# Patient Record
Sex: Female | Born: 2000 | Race: Black or African American | Hispanic: No | Marital: Single | Smoking: Never smoker
Health system: Southern US, Community
[De-identification: ages and names within clinical notes are randomized; demographics above are authoritative.]

## PROBLEM LIST (undated history)

## (undated) ENCOUNTER — Ambulatory Visit (HOSPITAL_COMMUNITY): Admission: EM | Source: Home / Self Care

## (undated) ENCOUNTER — Inpatient Hospital Stay (HOSPITAL_COMMUNITY): Payer: Self-pay

## (undated) DIAGNOSIS — Z789 Other specified health status: Secondary | ICD-10-CM

## (undated) DIAGNOSIS — O98319 Other infections with a predominantly sexual mode of transmission complicating pregnancy, unspecified trimester: Secondary | ICD-10-CM

## (undated) DIAGNOSIS — O99019 Anemia complicating pregnancy, unspecified trimester: Secondary | ICD-10-CM

## (undated) DIAGNOSIS — A568 Sexually transmitted chlamydial infection of other sites: Secondary | ICD-10-CM

## (undated) DIAGNOSIS — D509 Iron deficiency anemia, unspecified: Secondary | ICD-10-CM

## (undated) HISTORY — PX: NO PAST SURGERIES: SHX2092

---
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
## 2021-01-25 ENCOUNTER — Ambulatory Visit (HOSPITAL_COMMUNITY)
Admission: EM | Admit: 2021-01-25 | Discharge: 2021-01-25 | Disposition: A | Discharge status: Home / Self Care | Payer: Medicaid Other | Attending: Emergency Medicine | Admitting: Emergency Medicine

## 2021-01-25 ENCOUNTER — Encounter (HOSPITAL_COMMUNITY): Payer: Self-pay

## 2021-01-25 DIAGNOSIS — N898 Other specified noninflammatory disorders of vagina: Secondary | ICD-10-CM | POA: Diagnosis present

## 2021-01-25 NOTE — ED Triage Notes (Signed)
Pt c/o clear vaginal discharge with an odor x1wk. Pt c/o 2 bumps to vaginal area x3 days, denies drainage.

## 2021-01-25 NOTE — ED Provider Notes (Signed)
[facility]    CSN: [id number] Arrival date & time: 01/25/21  1413      History   Chief Complaint Chief Complaint  Patient presents with   Vaginal Discharge   Abscess    HPI Angela Hayden is a 20 y.o. female.   Patient presents with thick Angela Hayden discharge with odor for 7 days. Endorses 2 bumps to vaginal area for 3 days. Denies itching, irritation, urinary urgency, frequency, abdominal pain, flank pain, dysuria, hematuria, nausea. Has not attempted treatment. Sexually active, one partner, no condom use.   Past Medical History:  Diagnosis Date   Chlamydia trachomatis infection in pregnancy 07/20/2018   Iron deficiency anemia during pregnancy 07/20/2018   Medical history non-contributory     Patient Active Problem List   Diagnosis Date Noted   Postpartum anemia 08/09/2018   Postoperative fever 08/08/2018   Postpartum infection 08/08/2018   Nexplanon in place 07/23/2018    Past Surgical History:  Procedure Laterality Date   CESAREAN SECTION N/A 07/21/2018   Procedure: CESAREAN SECTION;  Surgeon: Adam Phenix, MD;  Location: [facility] BIRTHING SUIT[facility];  Service: Obstetrics;  Laterality: N/A;   NO PAST SURGERIES      OB History     Gravida  1   Para  1   Term  1   Preterm  0   AB  0   Living  1      SAB  0   IAB  0   Ectopic  0   Multiple  0   Live Births  1            Home Medications    Prior to Admission medications   Medication Sig Start Date End Date Taking? Authorizing Provider  amLODipine (NORVASC) 2.5 MG tablet Take 1 tablet (2.5 mg total) by mouth daily. Patient not taking: Reported on 10/04/2018 08/15/18 09/28/20  Adam Phenix, MD  ferrous sulfate 325 (65 FE) MG tablet Take 1 tablet (325 mg total) by mouth daily with breakfast. 09/25/18 09/28/20  Adam Phenix, MD    Family History Family History  Problem Relation Age of Onset   Healthy Mother    Healthy Father     Social History Social History   Tobacco Use    Smoking status: Never   Smokeless tobacco: Never  Vaping Use   Vaping Use: Never used  Substance Use Topics   Alcohol use: Never   Drug use: Never     Allergies   Patient has no known allergies.   Review of Systems Review of Systems  Constitutional: Negative.   Respiratory: Negative.    Cardiovascular: Negative.   Genitourinary:  Positive for vaginal discharge. Negative for decreased urine volume, difficulty urinating, dyspareunia, dysuria, enuresis, flank pain, frequency, genital sores, hematuria, menstrual problem, pelvic pain, urgency, vaginal bleeding and vaginal pain.  Skin: Negative.   Neurological: Negative.     Physical Exam Triage Vital Signs ED Triage Vitals  Enc Vitals Group     BP 01/25/21 1511 104/71     Pulse Rate 01/25/21 1511 88     Resp 01/25/21 1511 18     Temp 01/25/21 1511 98.3 F (36.8 C)     Temp Source 01/25/21 1511 Oral     SpO2 01/25/21 1511 98 %     Weight --      Height --      Head Circumference --      Peak Flow --      Pain  Score 01/25/21 1512 0     Pain Loc --      Pain Edu? --      Excl. in GC? --    No data found.  Updated Vital Signs BP 104/71 (BP Location: Left Arm)   Pulse 88   Temp 98.3 F (36.8 C) (Oral)   Resp 18   LMP 01/25/2021   SpO2 98%   Visual Acuity Right Eye Distance:   Left Eye Distance:   Bilateral Distance:    Right Eye Near:   Left Eye Near:    Bilateral Near:     Physical Exam Constitutional:      Appearance: Normal appearance. She is normal weight.  HENT:     Head: Normocephalic.  Pulmonary:     Effort: Pulmonary effort is normal.  Genitourinary:    Comments: Deferred,self collect vaginal swab Skin:    General: Skin is warm and dry.  Neurological:     Mental Status: She is alert and oriented to person, place, and time. Mental status is at baseline.     UC Treatments / Results  Labs (all labs ordered are listed, but only abnormal results are displayed) Labs Reviewed  CERVICOVAGINAL  ANCILLARY ONLY    EKG   Radiology No results found.  Procedures Procedures (including critical care time)  Medications Ordered in UC Medications - No data to display  Initial Impression / Assessment and Plan / UC Course  I have reviewed the triage vital signs and the nursing notes.  Pertinent labs & imaging results that were available during my care of the patient were reviewed by me and considered in my medical decision making (see chart for details).  Vaginal discharge  Unable to complete pelvic exam to assess lesions due to time constraint of patient  Sti screening pending, will treat per protocol, advised abstinence until labs results and/or treatment complete  Final Clinical Impressions(s) / UC Diagnoses   Final diagnoses:  Vaginal discharge     Discharge Instructions      Labs pending 2-3 days, you will be contacted if positive for any sti and treatment will be sent to the pharmacy, you will have to return to the clinic if positive for gonorrhea to receive treatment   Please refrain from having sex until labs results, if positive please refrain from having sex until treatment complete and symptoms resolve   If positive for Chlamydia  gonorrhea or trichomoniasis please notify partner or partners so they may tested as well  Moving forward, it is recommended you use some form of protection against the transmission of sti infections  such as condoms or dental dams with each sexual encounter     ED Prescriptions   None    PDMP not reviewed this encounter.   Valinda Hoar, [place] 01/25/21 [phone]

## 2021-01-25 NOTE — Discharge Instructions (Signed)
Labs pending 2-3 days, you will be contacted if positive for any sti and treatment will be sent to the pharmacy, you will have to return to the clinic if positive for gonorrhea to receive treatment   Please refrain from having sex until labs results, if positive please refrain from having sex until treatment complete and symptoms resolve   If positive for  Chlamydia  gonorrhea or trichomoniasis please notify partner or partners so they may tested as well  Moving forward, it is recommended you use some form of protection against the transmission of sti infections  such as condoms or dental dams with each sexual encounter    

## 2021-01-26 LAB — CERVICOVAGINAL ANCILLARY ONLY
Bacterial Vaginitis (gardnerella): POSITIVE — AB
Candida Glabrata: NEGATIVE
Candida Vaginitis: NEGATIVE
Chlamydia: NEGATIVE
Comment: NEGATIVE
Comment: NEGATIVE
Comment: NEGATIVE
Comment: NEGATIVE
Comment: NEGATIVE
Comment: NORMAL
Neisseria Gonorrhea: NEGATIVE
Trichomonas: NEGATIVE

## 2021-01-27 ENCOUNTER — Telehealth (HOSPITAL_COMMUNITY): Payer: Self-pay | Admitting: Emergency Medicine

## 2021-01-27 MED ORDER — METRONIDAZOLE 500 MG PO TABS: 500.0000 mg | ORAL_TABLET | Freq: Two times a day (BID) | ORAL | 0 refills | Status: DC

## 2021-01-27 MED ORDER — METRONIDAZOLE 0.75 % VA GEL: 1.0000 | Freq: Every day | VAGINAL | 0 refills | Status: DC

## 2021-03-10 ENCOUNTER — Other Ambulatory Visit: Payer: Self-pay

## 2021-03-10 ENCOUNTER — Ambulatory Visit (HOSPITAL_COMMUNITY)
Admission: EM | Admit: 2021-03-10 | Discharge: 2021-03-10 | Disposition: A | Discharge status: Home / Self Care | Payer: Medicaid Other | Attending: Physician Assistant | Admitting: Physician Assistant

## 2021-03-10 ENCOUNTER — Encounter (HOSPITAL_COMMUNITY): Payer: Self-pay

## 2021-03-10 DIAGNOSIS — N760 Acute vaginitis: Secondary | ICD-10-CM

## 2021-03-10 DIAGNOSIS — N898 Other specified noninflammatory disorders of vagina: Secondary | ICD-10-CM

## 2021-03-10 DIAGNOSIS — B9689 Other specified bacterial agents as the cause of diseases classified elsewhere: Secondary | ICD-10-CM | POA: Diagnosis not present

## 2021-03-10 DIAGNOSIS — Z113 Encounter for screening for infections with a predominantly sexual mode of transmission: Secondary | ICD-10-CM

## 2021-03-10 LAB — HIV ANTIBODY (ROUTINE TESTING W REFLEX): HIV Screen 4th Generation wRfx: NONREACTIVE

## 2021-03-10 LAB — POC URINE PREG, ED: Preg Test, Ur: NEGATIVE

## 2021-03-10 MED ORDER — METRONIDAZOLE 500 MG PO TABS: 500.0000 mg | ORAL_TABLET | Freq: Two times a day (BID) | ORAL | 0 refills | Status: DC

## 2021-03-10 NOTE — Discharge Instructions (Addendum)
We are treating you for bacterial vaginosis.  Please take metronidazole 500 mg twice daily for 7 days.  You cannot drink any alcohol while taking this medication and for 72 hours after completing course this will cause you to vomit.  I recommend you wear loosefitting cotton underwear and use hypoallergenic soaps and detergents.  We will contact you if we need to arrange any additional treatment.  If your symptoms persist please return for reevaluation.Angela Hayden

## 2021-03-10 NOTE — ED Provider Notes (Signed)
[facility]    CSN: [id number] Arrival date & time: 03/10/21  1702      History   Chief Complaint Chief Complaint  Patient presents with   Vaginal Discharge    HPI Angela Hayden is a 20 y.o. female.   Patient presents today with a 2-day history of vaginal discharge.  She describes this as copious, clear, malodorous.  She does have a history of bacterial vaginosis and was last treated August 2022 but reports not completing the complete course of medication as she kept forgetting.  She does report a new sexual partner and is interested in complete STI testing.  She denies any additional antibiotic use.  Denies any changes to personal hygiene products including soaps or detergents.  She denies any pelvic pain, abdominal pain, fever, nausea, vomiting, chest pain.  She does not believe she is pregnant but is interested in testing.   Past Medical History:  Diagnosis Date   Chlamydia trachomatis infection in pregnancy 07/20/2018   Iron deficiency anemia during pregnancy 07/20/2018   Medical history non-contributory     Patient Active Problem List   Diagnosis Date Noted   Postpartum anemia 08/09/2018   Postoperative fever 08/08/2018   Postpartum infection 08/08/2018   Nexplanon in place 07/23/2018    Past Surgical History:  Procedure Laterality Date   CESAREAN SECTION N/A 07/21/2018   Procedure: CESAREAN SECTION;  Surgeon: Adam Phenix, MD;  Location: [facility] BIRTHING SU[facility];  Service: Obstetrics;  Laterality: N/A;   NO PAST SURGERIES      OB History     Gravida  1   Para  1   Term  1   Preterm  0   AB  0   Living  1      SAB  0   IAB  0   Ectopic  0   Multiple  0   Live Births  1            Home Medications    Prior to Admission medications   Medication Sig Start Date End Date Taking? Authorizing Provider  metroNIDAZOLE (FLAGYL) 500 MG tablet Take 1 tablet (500 mg total) by mouth 2 (two) times daily. 03/10/21   Gavino Fouch, Noberto Retort, PA-C   amLODipine (NORVASC) 2.5 MG tablet Take 1 tablet (2.5 mg total) by mouth daily. Patient not taking: Reported on 10/04/2018 08/15/18 09/28/20  Adam Phenix, MD  ferrous sulfate 325 (65 FE) MG tablet Take 1 tablet (325 mg total) by mouth daily with breakfast. 09/25/18 09/28/20  Adam Phenix, MD    Family History Family History  Problem Relation Age of Onset   Healthy Mother    Healthy Father     Social History Social History   Tobacco Use   Smoking status: Never   Smokeless tobacco: Never  Vaping Use   Vaping Use: Never used  Substance Use Topics   Alcohol use: Never   Drug use: Never     Allergies   Patient has no known allergies.   Review of Systems Review of Systems  Constitutional:  Negative for activity change, appetite change, fatigue and fever.  Respiratory:  Negative for cough and shortness of breath.   Cardiovascular:  Negative for chest pain.  Gastrointestinal:  Negative for abdominal pain, diarrhea, nausea and vomiting.  Genitourinary:  Positive for vaginal discharge. Negative for flank pain, frequency, pelvic pain, urgency, vaginal bleeding and vaginal pain.  Neurological:  Negative for dizziness, light-headedness and headaches.    Physical  Exam Triage Vital Signs ED Triage Vitals  Enc Vitals Group     BP 03/10/21 1814 117/74     Pulse Rate 03/10/21 1814 77     Resp 03/10/21 1814 16     Temp 03/10/21 1814 99.2 F (37.3 C)     Temp Source 03/10/21 1814 Oral     SpO2 03/10/21 1814 98 %     Weight --      Height --      Head Circumference --      Peak Flow --      Pain Score 03/10/21 1813 0     Pain Loc --      Pain Edu? --      Excl. in GC? --    No data found.  Updated Vital Signs BP 117/74 (BP Location: Right Arm)   Pulse 77   Temp 99.2 F (37.3 C) (Oral)   Resp 16   SpO2 98%   Visual Acuity Right Eye Distance:   Left Eye Distance:   Bilateral Distance:    Right Eye Near:   Left Eye Near:    Bilateral Near:     Physical  Exam Vitals reviewed.  Constitutional:      General: She is awake. She is not in acute distress.    Appearance: Normal appearance. She is well-developed. She is not ill-appearing.     Comments: Very pleasant female present age no acute distress sitting comfortably in exam room  HENT:     Head: Normocephalic and atraumatic.  Cardiovascular:     Rate and Rhythm: Normal rate and regular rhythm.     Heart sounds: Normal heart sounds, S1 normal and S2 normal. No murmur heard. Pulmonary:     Effort: Pulmonary effort is normal.     Breath sounds: Normal breath sounds. No wheezing, rhonchi or rales.     Comments: Clear auscultation bilaterally Abdominal:     General: Bowel sounds are normal.     Palpations: Abdomen is soft.     Tenderness: There is no abdominal tenderness. There is no right CVA tenderness, left CVA tenderness, guarding or rebound.     Comments: Benign abdominal exam  Genitourinary:    Comments: Exam deferred Psychiatric:        Behavior: Behavior is cooperative.     UC Treatments / Results  Labs (all labs ordered are listed, but only abnormal results are displayed) Labs Reviewed  HIV ANTIBODY (ROUTINE TESTING W REFLEX)  RPR  HEPATITIS C ANTIBODY  POC URINE PREG, ED  CERVICOVAGINAL ANCILLARY ONLY    EKG   Radiology No results found.  Procedures Procedures (including critical care time)  Medications Ordered in UC Medications - No data to display  Initial Impression / Assessment and Plan / UC Course  I have reviewed the triage vital signs and the nursing notes.  Pertinent labs & imaging results that were available during my care of the patient were reviewed by me and considered in my medical decision making (see chart for details).      Urine pregnancy was negative in clinic today.  STI swab collected results pending.  Will empirically treat for bacterial vaginosis.  Patient prescribed metronidazole 500 mg twice daily for 7 days.  Discussed that she  should not drink any alcohol while taking this medication and for 72 hours after completing course due to Antabuse side effects.  Recommended she use loosefitting cotton underwear and hypoallergenic soaps and detergents.  She was to abstain from sexual  practices until STI results are obtained.  Discussed that if she is positive all partners will need to be tested and treated.  Discussed the importance of safe sex practices.  Strict return precautions given to which she expressed understanding.  Final Clinical Impressions(s) / UC Diagnoses   Final diagnoses:  Vaginal discharge  BV (bacterial vaginosis)  Routine screening for STI (sexually transmitted infection)     Discharge Instructions      We are treating you for bacterial vaginosis.  Please take metronidazole 500 mg twice daily for 7 days.  You cannot drink any alcohol while taking this medication and for 72 hours after completing course this will cause you to vomit.  I recommend you wear loosefitting cotton underwear and use hypoallergenic soaps and detergents.  We will contact you if we need to arrange any additional treatment.  If your symptoms persist please return for reevaluation..     ED Prescriptions     Medication Sig Dispense Auth. Provider   metroNIDAZOLE (FLAGYL) 500 MG tablet Take 1 tablet (500 mg total) by mouth 2 (two) times daily. 14 tablet Travaughn Vue, Noberto Retort, PA-C      PDMP not reviewed this encounter.   Jeani Hawking, PA-C 03/10/21 1851

## 2021-03-10 NOTE — ED Triage Notes (Signed)
Pt reports vaginal discharge with strong odor x 2 days. Pt reports she did not finished the bacterial vaginosis the last time.

## 2021-03-11 ENCOUNTER — Telehealth (HOSPITAL_COMMUNITY): Payer: Self-pay | Admitting: Emergency Medicine

## 2021-03-11 LAB — CERVICOVAGINAL ANCILLARY ONLY
Bacterial Vaginitis (gardnerella): POSITIVE — AB
Candida Glabrata: NEGATIVE
Candida Vaginitis: POSITIVE — AB
Chlamydia: NEGATIVE
Comment: NEGATIVE
Comment: NEGATIVE
Comment: NEGATIVE
Comment: NEGATIVE
Comment: NEGATIVE
Comment: NORMAL
Neisseria Gonorrhea: NEGATIVE
Trichomonas: NEGATIVE

## 2021-03-11 LAB — RPR: RPR Ser Ql: NONREACTIVE

## 2021-03-11 MED ORDER — FLUCONAZOLE 150 MG PO TABS: 150.0000 mg | ORAL_TABLET | Freq: Once | ORAL | 0 refills | Status: AC

## 2021-03-12 LAB — HEPATITIS C ANTIBODY: HCV Ab: <0.1 s/co ratio — AB (ref 0.0–0.9)

## 2021-03-29 ENCOUNTER — Encounter (HOSPITAL_COMMUNITY): Payer: Self-pay

## 2021-03-29 ENCOUNTER — Other Ambulatory Visit: Payer: Self-pay

## 2021-03-29 ENCOUNTER — Ambulatory Visit (HOSPITAL_COMMUNITY)
Admission: EM | Admit: 2021-03-29 | Discharge: 2021-03-29 | Disposition: A | Discharge status: Home / Self Care | Payer: Medicaid Other | Attending: Emergency Medicine | Admitting: Emergency Medicine

## 2021-03-29 DIAGNOSIS — B9689 Other specified bacterial agents as the cause of diseases classified elsewhere: Secondary | ICD-10-CM | POA: Diagnosis not present

## 2021-03-29 DIAGNOSIS — N760 Acute vaginitis: Secondary | ICD-10-CM

## 2021-03-29 MED ORDER — CLINDAMYCIN HCL 300 MG PO CAPS: 300.0000 mg | ORAL_CAPSULE | Freq: Two times a day (BID) | ORAL | 0 refills | Status: AC

## 2021-03-29 NOTE — Discharge Instructions (Addendum)
Take clindamycin twice a day for 7 days   Use boric acid suppository as preventative method, may use weekly and after sexual encounters to attempt to prevent symptoms  Do not have sex until symptoms resolve  May follow up with gynecology for reoccurring symptoms

## 2021-03-29 NOTE — ED Triage Notes (Signed)
Pt reports malodorous vaginal discharge x 1 month. States she was treated for bacterial vaginosis 3 weeks ago without relief.

## 2021-03-29 NOTE — ED Provider Notes (Signed)
[facility]    CSN: [id number] Arrival date & time: 03/29/21  2353      History   Chief Complaint Chief Complaint  Patient presents with   Vaginal Discharge    HPI Angela Hayden is a 20 y.o. female.   Patient presents with Angela Hayden thin vaginal discharge with odor occurring intermittently for 1 month.  Denies vaginal itching or irritation, urinary frequency or urgency, abdominal pain, flank pain, pelvic pain, hematuria, new rash or lesions.  Endorses she was seen on October 12 in urgent care, positive for bacterial vaginosis and yeast, completed treatment but symptoms did not resolve.  Endorses frequent BV infections.  Denies sexual symptoms since last testing.   Past Medical History:  Diagnosis Date   Chlamydia trachomatis infection in pregnancy 07/20/2018   Iron deficiency anemia during pregnancy 07/20/2018   Medical history non-contributory     Patient Active Problem List   Diagnosis Date Noted   Postpartum anemia 08/09/2018   Postoperative fever 08/08/2018   Postpartum infection 08/08/2018   Nexplanon in place 07/23/2018    Past Surgical History:  Procedure Laterality Date   CESAREAN SECTION N/A 07/21/2018   Procedure: CESAREAN SECTION;  Surgeon: Adam Phenix, MD;  Location: [facility]TH[facility] SU[facility];  Service: Obstetrics;  Laterality: N/A;   NO PAST SURGERIES      OB History     Gravida  1   Para  1   Term  1   Preterm  0   AB  0   Living  1      SAB  0   IAB  0   Ectopic  0   Multiple  0   Live Births  1            Home Medications    Prior to Admission medications   Medication Sig Start Date End Date Taking? Authorizing Provider  clindamycin (CLEOCIN) 300 MG capsule Take 1 capsule (300 mg total) by mouth in the morning and at bedtime for 7 days. 03/29/21 04/05/21 Yes Murline Weigel R, NP  metroNIDAZOLE (FLAGYL) 500 MG tablet Take 1 tablet (500 mg total) by mouth 2 (two) times daily. 03/10/21   Raspet, Noberto Retort, PA-C  amLODipine  (NORVASC) 2.5 MG tablet Take 1 tablet (2.5 mg total) by mouth daily. Patient not taking: Reported on 10/04/2018 08/15/18 09/28/20  Adam Phenix, MD  ferrous sulfate 325 (65 FE) MG tablet Take 1 tablet (325 mg total) by mouth daily with breakfast. 09/25/18 09/28/20  Adam Phenix, MD    Family History Family History  Problem Relation Age of Onset   Healthy Mother    Healthy Father     Social History Social History   Tobacco Use   Smoking status: Never   Smokeless tobacco: Never  Vaping Use   Vaping Use: Never used  Substance Use Topics   Alcohol use: Never   Drug use: Never     Allergies   Patient has no known allergies.   Review of Systems Review of Systems  Respiratory: Negative.    Cardiovascular: Negative.   Gastrointestinal: Negative.   Genitourinary:  Positive for vaginal discharge. Negative for decreased urine volume, difficulty urinating, dyspareunia, dysuria, enuresis, flank pain, frequency, genital sores, hematuria, menstrual problem, pelvic pain, urgency, vaginal bleeding and vaginal pain.  Musculoskeletal: Negative.   Skin: Negative.   Neurological: Negative.     Physical Exam Triage Vital Signs ED Triage Vitals  Enc Vitals Group     BP 03/29/21  0931 104/70     Pulse Rate 03/29/21 0931 70     Resp 03/29/21 0931 16     Temp 03/29/21 0931 98.1 F (36.7 C)     Temp Source 03/29/21 0931 Oral     SpO2 03/29/21 0931 98 %     Weight --      Height --      Head Circumference --      Peak Flow --      Pain Score 03/29/21 0930 0     Pain Loc --      Pain Edu? --      Excl. in [place]? --    No data found.  Updated Vital Signs BP 104/70 (BP Location: Left Arm)   Pulse 70   Temp 98.1 F (36.7 C) (Oral)   Resp 16   LMP  (Within Days)   SpO2 98%   Visual Acuity Right Eye Distance:   Left Eye Distance:   Bilateral Distance:    Right Eye Near:   Left Eye Near:    Bilateral Near:     Physical Exam Constitutional:      Appearance: Normal appearance.  She is normal weight.  HENT:     Head: Normocephalic.  Eyes:     Extraocular Movements: Extraocular movements intact.  Pulmonary:     Effort: Pulmonary effort is normal.  Genitourinary:    Comments: deferred Skin:    General: Skin is warm and dry.  Neurological:     Mental Status: She is alert and oriented to person, place, and time. Mental status is at baseline.  Psychiatric:        Mood and Affect: Mood normal.        Behavior: Behavior normal.     UC Treatments / Results  Labs (all labs ordered are listed, but only abnormal results are displayed) Labs Reviewed - No data to display  EKG   Radiology No results found.  Procedures Procedures (including critical care time)  Medications Ordered in UC Medications - No data to display  Initial Impression / Assessment and Plan / UC Course  I have reviewed the triage vital signs and the nursing notes.  Pertinent labs & imaging results that were available during my care of the patient were reviewed by me and considered in my medical decision making (see chart for details).  Bacterial vaginosis  Due to history and symptomology will prophylactically treat for BV, will defer testing at this time due to lack of sexual activity  1.  Clindamycin 300 mg twice daily for 7 days 2.  Recommended prophylactic weekly use of boric acid suppository  3.  Recommended follow-up with gynecology, resources given Final Clinical Impressions(s) / UC Diagnoses   Final diagnoses:  Bacterial vaginosis     Discharge Instructions      Take clindamycin twice a day for 7 days   Use boric acid suppository as preventative method, may use weekly and after sexual encounters to attempt to prevent symptoms  Do not have sex until symptoms resolve  May follow up with gynecology for reoccurring symptoms     ED Prescriptions     Medication Sig Dispense Auth. Provider   clindamycin (CLEOCIN) 300 MG capsule Take 1 capsule (300 mg total) by  mouth in the morning and at bedtime for 7 days. 14 capsule Carreen Milius, Leitha Schuller, NP      PDMP not reviewed this encounter.   Hans Eden, [place] 03/29/21 [phone]

## 2021-05-26 ENCOUNTER — Encounter (HOSPITAL_COMMUNITY): Payer: Self-pay

## 2021-05-26 ENCOUNTER — Other Ambulatory Visit: Payer: Self-pay

## 2021-05-26 ENCOUNTER — Ambulatory Visit (HOSPITAL_COMMUNITY)
Admission: RE | Admit: 2021-05-26 | Discharge: 2021-05-26 | Disposition: A | Discharge status: Home / Self Care | Payer: Medicaid Other | Source: Ambulatory Visit | Attending: Urgent Care | Admitting: Urgent Care

## 2021-05-26 VITALS — BP 104/54 | HR 82 | Temp 98.3°F | Resp 18

## 2021-05-26 DIAGNOSIS — N760 Acute vaginitis: Secondary | ICD-10-CM | POA: Diagnosis present

## 2021-05-26 LAB — POCT URINALYSIS DIPSTICK, ED / UC
Bilirubin Urine: NEGATIVE
Bilirubin Urine: NEGATIVE
Glucose, UA: NEGATIVE mg/dL
Glucose, UA: NEGATIVE mg/dL
Hgb urine dipstick: NEGATIVE
Hgb urine dipstick: NEGATIVE
Ketones, ur: NEGATIVE mg/dL
Leukocytes,Ua: NEGATIVE
Leukocytes,Ua: NEGATIVE
Nitrite: NEGATIVE
Nitrite: NEGATIVE
Protein, ur: NEGATIVE mg/dL
Protein, ur: NEGATIVE mg/dL
Specific Gravity, Urine: 1.025 (ref 1.005–1.030)
Specific Gravity, Urine: 1.025 (ref 1.005–1.030)
Urobilinogen, UA: 0.2 mg/dL (ref 0.0–1.0)
Urobilinogen, UA: 0.2 mg/dL (ref 0.0–1.0)
pH: 7 (ref 5.0–8.0)
pH: 7.5 (ref 5.0–8.0)

## 2021-05-26 LAB — POC URINE PREG, ED: Preg Test, Ur: NEGATIVE

## 2021-05-26 MED ORDER — TINIDAZOLE 500 MG PO TABS: 2.0000 g | ORAL_TABLET | Freq: Once | ORAL | 0 refills | Status: AC

## 2021-05-26 NOTE — ED Provider Notes (Signed)
[facility]    CSN: [id number] Arrival date & time: 05/26/21  1529      History   Chief Complaint Chief Complaint  Patient presents with   APPOINTMENT: Abnormal Vaginal Discharge    HPI Angela Hayden is a 20 y.o. female.   20 year old female presents today with concerns of 2-day history of vaginal discharge.  Does have a strong history of BV and states her symptoms feel similar.  She reports being monogamous with 1 partner.  She does not use protection.  She denies pelvic pain, fever, nausea, vomiting.  She denies any rash or swelling.      Past Medical History:  Diagnosis Date   Chlamydia trachomatis infection in pregnancy 07/20/2018   Iron deficiency anemia during pregnancy 07/20/2018   Medical history non-contributory     Patient Active Problem List   Diagnosis Date Noted   Postpartum anemia 08/09/2018   Postoperative fever 08/08/2018   Postpartum infection 08/08/2018   Nexplanon in place 07/23/2018    Past Surgical History:  Procedure Laterality Date   CESAREAN SECTION N/A 07/21/2018   Procedure: CESAREAN SECTION;  Surgeon: Woodroe Mode, MD;  Location: [facility];  Service: Obstetrics;  Laterality: N/A;   NO PAST SURGERIES      OB History     Gravida  1   Para  1   Term  1   Preterm  0   AB  0   Living  1      SAB  0   IAB  0   Ectopic  0   Multiple  0   Live Births  1            Home Medications    Prior to Admission medications   Medication Sig Start Date End Date Taking? Authorizing Provider  tinidazole (TINDAMAX) 500 MG tablet Take 4 tablets (2,000 mg total) by mouth once for 1 dose. 05/26/21 05/26/21 Yes Joannie Medine L, PA  amLODipine (NORVASC) 2.5 MG tablet Take 1 tablet (2.5 mg total) by mouth daily. Patient not taking: Reported on 10/04/2018 08/15/18 09/28/20  Woodroe Mode, MD  ferrous sulfate 325 (65 FE) MG tablet Take 1 tablet (325 mg total) by mouth daily with breakfast. 09/25/18 09/28/20  Woodroe Mode, MD    Family History Family History  Problem Relation Age of Onset   Healthy Mother    Healthy Father     Social History Social History   Tobacco Use   Smoking status: Never   Smokeless tobacco: Never  Vaping Use   Vaping Use: Never used  Substance Use Topics   Alcohol use: Never   Drug use: Never     Allergies   Patient has no known allergies.   Review of Systems Review of Systems  Constitutional: Negative.   HENT: Negative.    Eyes: Negative.   Respiratory: Negative.    Cardiovascular: Negative.   Gastrointestinal: Negative.   Genitourinary:  Positive for vaginal discharge. Negative for decreased urine volume, difficulty urinating, dyspareunia, dysuria, enuresis, flank pain, frequency, genital sores, hematuria, menstrual problem, pelvic pain, urgency, vaginal bleeding and vaginal pain.  Musculoskeletal: Negative.   Neurological: Negative.   Hematological: Negative.   Psychiatric/Behavioral: Negative.    All other systems reviewed and are negative.   Physical Exam Triage Vital Signs ED Triage Vitals  Enc Vitals Group     BP 05/26/21 1643 (!) 104/54     Pulse Rate 05/26/21 1643 82  Resp 05/26/21 1643 18     Temp 05/26/21 1643 98.3 F (36.8 C)     Temp Source 05/26/21 1643 Oral     SpO2 05/26/21 1643 100 %     Weight --      Height --      Head Circumference --      Peak Flow --      Pain Score 05/26/21 1642 0     Pain Loc --      Pain Edu? --      Excl. in GC? --    No data found.  Updated Vital Signs BP (!) 104/54 (BP Location: Right Arm)    Pulse 82    Temp 98.3 F (36.8 C) (Oral)    Resp 18    LMP 05/16/2021    SpO2 100%   Visual Acuity Right Eye Distance:   Left Eye Distance:   Bilateral Distance:    Right Eye Near:   Left Eye Near:    Bilateral Near:     Physical Exam Vitals and nursing note reviewed.  Constitutional:      Appearance: She is well-developed and normal weight.  HENT:     Head: Normocephalic.   Cardiovascular:     Rate and Rhythm: Normal rate.     Heart sounds: No murmur heard. Pulmonary:     Effort: Pulmonary effort is normal. No respiratory distress.     Breath sounds: No wheezing.  Abdominal:     General: Abdomen is flat. Bowel sounds are normal. There is no distension. There are no signs of injury.     Palpations: Abdomen is soft. There is no shifting dullness, fluid wave, hepatomegaly, splenomegaly, mass or pulsatile mass.     Tenderness: There is no abdominal tenderness. There is no right CVA tenderness, left CVA tenderness, guarding or rebound. Negative signs include Murphy's sign, Rovsing's sign, McBurney's sign, psoas sign and obturator sign.     Hernia: No hernia is present.  Genitourinary:    General: Normal vulva.     Pubic Area: No rash or pubic lice.      Labia:        Right: No rash, tenderness, lesion or injury.        Left: No rash, tenderness, lesion or injury.      Urethra: No prolapse, urethral pain, urethral swelling or urethral lesion.     Vagina: No signs of injury and foreign body. Vaginal discharge present. No erythema, tenderness, bleeding, lesions or prolapsed vaginal walls.     Cervix: No cervical motion tenderness, discharge, friability, lesion, erythema or eversion.     Uterus: Normal. Not deviated, not enlarged, not fixed, not tender and no uterine prolapse.      Adnexa: Right adnexa normal and left adnexa normal.       Right: No mass, tenderness or fullness.         Left: No mass, tenderness or fullness.       Rectum: Normal.  Neurological:     Mental Status: She is alert.     UC Treatments / Results  Labs (all labs ordered are listed, but only abnormal results are displayed) Labs Reviewed  POCT URINALYSIS DIPSTICK, ED / UC - Abnormal; Notable for the following components:      Result Value   Ketones, ur TRACE (*)    All other components within normal limits  POC URINE PREG, ED  POCT URINALYSIS DIPSTICK, ED / UC  CERVICOVAGINAL  ANCILLARY ONLY  EKG   Radiology No results found.  Procedures Procedures (including critical care time)  Medications Ordered in UC Medications - No data to display  Initial Impression / Assessment and Plan / UC Course  I have reviewed the triage vital signs and the nursing notes.  Pertinent labs & imaging results that were available during my care of the patient were reviewed by me and considered in my medical decision making (see chart for details).     Vaginitis - given hx of this, suspect BV. Pt believes she is resistant to metronidazole. Will do trial of tinidazole. F/U with PCP. Will call with results of aptima swab and tx any additional positive tests   Final Clinical Impressions(s) / UC Diagnoses   Final diagnoses:  Vaginitis and vulvovaginitis     Discharge Instructions      You were swabbed for different infections that can cause vaginal discharge today. Will recommend treatment for Bacterial Vaginosis as you have had this in the past. Tinidazole is recommended, 4 tablets in one single dose. Follow up with your PCP or establish with a gynecologist if your symptoms continue to recur.     ED Prescriptions     Medication Sig Dispense Auth. Provider   tinidazole (TINDAMAX) 500 MG tablet Take 4 tablets (2,000 mg total) by mouth once for 1 dose. 4 tablet Selwyn Reason L, [place]      PDMP not reviewed this encounter.   Chaney Malling, [place] 05/26/21 2118

## 2021-05-26 NOTE — Discharge Instructions (Addendum)
You were swabbed for different infections that can cause vaginal discharge today. Will recommend treatment for Bacterial Vaginosis as you have had this in the past. Tinidazole is recommended, 4 tablets in one single dose. Follow up with your PCP or establish with a gynecologist if your symptoms continue to recur.

## 2021-05-26 NOTE — ED Triage Notes (Signed)
Pt presents with abnormal vaginal discharge X 4 days.

## 2021-05-27 ENCOUNTER — Telehealth (HOSPITAL_COMMUNITY): Payer: Self-pay | Admitting: Emergency Medicine

## 2021-05-27 LAB — CERVICOVAGINAL ANCILLARY ONLY
Bacterial Vaginitis (gardnerella): POSITIVE — AB
Candida Glabrata: NEGATIVE
Candida Vaginitis: POSITIVE — AB
Chlamydia: NEGATIVE
Comment: NEGATIVE
Comment: NEGATIVE
Comment: NEGATIVE
Comment: NEGATIVE
Comment: NEGATIVE
Comment: NORMAL
Neisseria Gonorrhea: NEGATIVE
Trichomonas: NEGATIVE

## 2021-05-27 MED ORDER — FLUCONAZOLE 150 MG PO TABS: 150.0000 mg | ORAL_TABLET | Freq: Once | ORAL | 0 refills | Status: AC

## 2021-06-23 ENCOUNTER — Other Ambulatory Visit: Payer: Self-pay

## 2021-06-23 ENCOUNTER — Ambulatory Visit (HOSPITAL_COMMUNITY)
Admission: EM | Admit: 2021-06-23 | Discharge: 2021-06-23 | Disposition: A | Discharge status: Home / Self Care | Payer: Medicaid Other | Attending: Urgent Care | Admitting: Urgent Care

## 2021-06-23 ENCOUNTER — Encounter (HOSPITAL_COMMUNITY): Payer: Self-pay | Admitting: Emergency Medicine

## 2021-06-23 DIAGNOSIS — N761 Subacute and chronic vaginitis: Secondary | ICD-10-CM | POA: Diagnosis present

## 2021-06-23 MED ORDER — METRONIDAZOLE 0.75 % VA GEL: VAGINAL | 0 refills | Status: DC

## 2021-06-23 MED ORDER — TINIDAZOLE 500 MG PO TABS: 2.0000 g | ORAL_TABLET | Freq: Once | ORAL | 0 refills | Status: AC

## 2021-06-23 NOTE — ED Provider Notes (Signed)
Angela Hayden    CSN: [id number] Arrival date & time: 06/23/21  1746      History   Chief Complaint Chief Complaint  Patient presents with   Vaginal Discharge    HPI Angela Hayden is a 21 y.o. female.   21 year old female presents today with concerns of 2-day history of vaginal discharge.  Does have a strong history of BV and states her symptoms feel similar.  She reports being monogamous with 1 partner.  She does not use protection.  She denies pelvic pain, fever, nausea, vomiting.  She denies any rash or swelling.  She just got off her menstrual period and feels that this is the trigger for her.  She has used metronidazole numerous times in the past, her last visit she used tinidazole which she felt was slightly more effective. No additional concerns or complaints.   Vaginal Discharge Associated symptoms: no dyspareunia and no dysuria    Past Medical History:  Diagnosis Date   Chlamydia trachomatis infection in pregnancy 07/20/2018   Iron deficiency anemia during pregnancy 07/20/2018   Medical history non-contributory     Patient Active Problem List   Diagnosis Date Noted   Postpartum anemia 08/09/2018   Postoperative fever 08/08/2018   Postpartum infection 08/08/2018   Nexplanon in place 07/23/2018    Past Surgical History:  Procedure Laterality Date   CESAREAN SECTION N/A 07/21/2018   Procedure: CESAREAN SECTION;  Surgeon: Woodroe Mode, MD;  Location: [facility];  Service: Obstetrics;  Laterality: N/A;   NO PAST SURGERIES      OB History     Gravida  1   Para  1   Term  1   Preterm  0   AB  0   Living  1      SAB  0   IAB  0   Ectopic  0   Multiple  0   Live Births  1            Home Medications    Prior to Admission medications   Medication Sig Start Date End Date Taking? Authorizing Provider  metroNIDAZOLE (METROGEL) 0.75 % vaginal gel Use one applicatorful per vagina Q HS x 5 days; start on day 4 of your  menstrual cycle monthly for prevention. 06/23/21  Yes Brianda Beitler L, PA  tinidazole (TINDAMAX) 500 MG tablet Take 4 tablets (2,000 mg total) by mouth once for 1 dose. 06/23/21 06/23/21 Yes Octavis Sheeler L, PA  amLODipine (NORVASC) 2.5 MG tablet Take 1 tablet (2.5 mg total) by mouth daily. Patient not taking: Reported on 10/04/2018 08/15/18 09/28/20  Woodroe Mode, MD  ferrous sulfate 325 (65 FE) MG tablet Take 1 tablet (325 mg total) by mouth daily with breakfast. 09/25/18 09/28/20  Woodroe Mode, MD    Family History Family History  Problem Relation Age of Onset   Healthy Mother    Healthy Father     Social History Social History   Tobacco Use   Smoking status: Never   Smokeless tobacco: Never  Vaping Use   Vaping Use: Never used  Substance Use Topics   Alcohol use: Never   Drug use: Never     Allergies   Patient has no known allergies.   Review of Systems Review of Systems  Constitutional: Negative.   HENT: Negative.    Eyes: Negative.   Respiratory: Negative.    Cardiovascular: Negative.   Gastrointestinal: Negative.   Genitourinary:  Positive for vaginal discharge.  Negative for decreased urine volume, difficulty urinating, dyspareunia, dysuria, enuresis, flank pain, frequency, genital sores, hematuria, menstrual problem, pelvic pain, urgency, vaginal bleeding and vaginal pain.  Musculoskeletal: Negative.   Neurological: Negative.   Hematological: Negative.   Psychiatric/Behavioral: Negative.    All other systems reviewed and are negative.   Physical Exam Triage Vital Signs ED Triage Vitals  Enc Vitals Group     BP 05/26/21 1643 (!) 104/54     Pulse Rate 05/26/21 1643 82     Resp 05/26/21 1643 18     Temp 05/26/21 1643 98.3 F (36.8 C)     Temp Source 05/26/21 1643 Oral     SpO2 05/26/21 1643 100 %     Weight --      Height --      Head Circumference --      Peak Flow --      Pain Score 05/26/21 1642 0     Pain Loc --      Pain Edu? --      Excl. in  [place]? --    No data found.  Updated Vital Signs BP 104/64 (BP Location: Right Arm)    Pulse 98    Temp 98.2 F (36.8 C) (Oral)    Resp 16    LMP 06/11/2021    SpO2 100%   Visual Acuity Right Eye Distance:   Left Eye Distance:   Bilateral Distance:    Right Eye Near:   Left Eye Near:    Bilateral Near:     Physical Exam Vitals and nursing note reviewed.  Constitutional:      Appearance: She is well-developed and normal weight.  HENT:     Head: Normocephalic.     Mouth/Throat:     Mouth: Mucous membranes are moist.  Eyes:     General: No scleral icterus.    Pupils: Pupils are equal, round, and reactive to light.  Cardiovascular:     Rate and Rhythm: Normal rate.     Heart sounds: No murmur heard. Pulmonary:     Effort: Pulmonary effort is normal. No respiratory distress.     Breath sounds: No wheezing.  Abdominal:     General: Abdomen is flat. Bowel sounds are normal. There is no distension. There are no signs of injury.     Palpations: Abdomen is soft. There is no shifting dullness, fluid wave, hepatomegaly, splenomegaly, mass or pulsatile mass.     Tenderness: There is no abdominal tenderness. There is no right CVA tenderness, left CVA tenderness, guarding or rebound. Negative signs include Murphy's sign, Rovsing's sign, McBurney's sign, psoas sign and obturator sign.     Hernia: No hernia is present.  Genitourinary:    Comments: Exam deferred Skin:    General: Skin is warm.     Findings: No erythema or rash.  Neurological:     Mental Status: She is alert.  Psychiatric:        Mood and Affect: Mood normal.     UC Treatments / Results  Labs (all labs ordered are listed, but only abnormal results are displayed) Labs Reviewed  CERVICOVAGINAL ANCILLARY ONLY    EKG   Radiology No results found.  Procedures Procedures (including critical care time)  Medications Ordered in UC Medications - No data to display  Initial Impression / Assessment and Plan / UC  Course  I have reviewed the triage vital signs and the nursing notes.  Pertinent labs & imaging results that were available during my  care of the patient were reviewed by me and considered in my medical decision making (see chart for details).     Vaginitis - given hx of this, suspect BV. Pt believes she is resistant to metronidazole. Will do trial of tinidazole. F/U with PCP. Will call with results of aptima swab and tx any additional positive tests. Will also start prophylactic metronidazole vaginal gel for patient to try after menses monthly for prevention.  Final Clinical Impressions(s) / UC Diagnoses   Final diagnoses:  Subacute vaginitis     Discharge Instructions      You appear to have recurrent BV.  I would recommend scheduling an appointment with a gynecologist for further evaluation. In the meantime, please take 4 tabs of tinidazole in a single dose today. Then, next month on day three or four of your menstrual period, start using metronidazole vaginal suppository for five days for prevention. We will call with results of your vaginal swab if anything else is positive apart from bacterial vaginosis.    ED Prescriptions     Medication Sig Dispense Auth. Provider   tinidazole (TINDAMAX) 500 MG tablet Take 4 tablets (2,000 mg total) by mouth once for 1 dose. 4 tablet Miner Koral L, PA   metroNIDAZOLE (METROGEL) 0.75 % vaginal gel Use one applicatorful per vagina Q HS x 5 days; start on day 4 of your menstrual cycle monthly for prevention. 70 g Takoda Siedlecki L, [place]      PDMP not reviewed this encounter.   Chaney Malling, [place] 05/26/21 2118    Chaney Malling, [place] 06/23/21 [phone]

## 2021-06-23 NOTE — Discharge Instructions (Signed)
You appear to have recurrent BV.  I would recommend scheduling an appointment with a gynecologist for further evaluation. In the meantime, please take 4 tabs of tinidazole in a single dose today. Then, next month on day three or four of your menstrual period, start using metronidazole vaginal suppository for five days for prevention. We will call with results of your vaginal swab if anything else is positive apart from bacterial vaginosis.

## 2021-06-23 NOTE — ED Triage Notes (Signed)
Pt reports her vaginal discharge has been abnormal for past 2 days. Reports after her cycle ends has tendencies to get BV.

## 2021-06-25 ENCOUNTER — Telehealth: Payer: Self-pay

## 2021-06-25 LAB — CERVICOVAGINAL ANCILLARY ONLY
Bacterial Vaginitis (gardnerella): POSITIVE — AB
Candida Glabrata: NEGATIVE
Candida Vaginitis: NEGATIVE
Chlamydia: NEGATIVE
Comment: NEGATIVE
Comment: NEGATIVE
Comment: NEGATIVE
Comment: NEGATIVE
Comment: NEGATIVE
Comment: NORMAL
Neisseria Gonorrhea: NEGATIVE
Trichomonas: NEGATIVE

## 2021-06-25 MED ORDER — METRONIDAZOLE 500 MG PO TABS: 500.0000 mg | ORAL_TABLET | Freq: Two times a day (BID) | ORAL | 0 refills | Status: DC

## 2021-07-31 ENCOUNTER — Other Ambulatory Visit: Payer: Self-pay

## 2021-07-31 ENCOUNTER — Encounter (HOSPITAL_COMMUNITY): Payer: Self-pay | Admitting: Emergency Medicine

## 2021-07-31 ENCOUNTER — Ambulatory Visit (HOSPITAL_COMMUNITY)
Admission: EM | Admit: 2021-07-31 | Discharge: 2021-07-31 | Disposition: A | Discharge status: Home / Self Care | Payer: Medicaid Other | Attending: Nurse Practitioner | Admitting: Nurse Practitioner

## 2021-07-31 DIAGNOSIS — N760 Acute vaginitis: Secondary | ICD-10-CM

## 2021-07-31 DIAGNOSIS — B3731 Acute candidiasis of vulva and vagina: Secondary | ICD-10-CM

## 2021-07-31 DIAGNOSIS — B9689 Other specified bacterial agents as the cause of diseases classified elsewhere: Secondary | ICD-10-CM

## 2021-07-31 MED ORDER — METRONIDAZOLE 500 MG PO TABS: 500.0000 mg | ORAL_TABLET | Freq: Two times a day (BID) | ORAL | 0 refills | Status: AC

## 2021-07-31 MED ORDER — FLUCONAZOLE 150 MG PO TABS: 150.0000 mg | ORAL_TABLET | Freq: Every day | ORAL | 0 refills | Status: AC

## 2021-07-31 NOTE — ED Triage Notes (Signed)
Pt reports vaginal discharge with odor x 1 week.  ?

## 2021-07-31 NOTE — ED Provider Notes (Signed)
?[facility] ? ? ? ?CSN: [id number] ?Arrival date & time: 07/31/21  1650 ? ? ?  ? ?History   ?Chief Complaint ?Chief Complaint  ?Patient presents with  ? Vaginal Discharge  ? ? ?HPI ?Angela Hayden is a 21 y.o. female.  ? ?Patient is a 21 year old female who presents for vaginal discharge.  She states she has had a vaginal discharge for approximately 1 week.  She also complains of vaginal odor.  She describes the discharge as thick, white, and clumpy.  She describes the odor as a "fishy" smell.  She denies abdominal pain, pelvic pain, or urinary symptoms.  She is sexually active, has 1 female partner, does not use condoms.  Her last menstrual cycle was around February 13.  She states that she was just recently treated for bacterial vaginosis and yeast but she did not finish her medication.  States that she did have sex after that and noticed symptoms shortly thereafter.  Previous history of chlamydia.  He is not concerned for STI at this time. ? ? ?Vaginal Discharge ?Quality:  Thick ?Progression:  Unchanged ?Relieved by:  None tried ?Associated symptoms: no abdominal pain, no dyspareunia, no urinary frequency, no urinary hesitancy, no urinary incontinence and no vaginal itching   ?Risk factors: unprotected sex   ? ?Past Medical History:  ?Diagnosis Date  ? Chlamydia trachomatis infection in pregnancy 07/20/2018  ? Iron deficiency anemia during pregnancy 07/20/2018  ? Medical history non-contributory   ? ? ?Patient Active Problem List  ? Diagnosis Date Noted  ? Postpartum anemia 08/09/2018  ? Postoperative fever 08/08/2018  ? Postpartum infection 08/08/2018  ? Nexplanon in place 07/23/2018  ? ? ?Past Surgical History:  ?Procedure Laterality Date  ? CESAREAN SECTION N/A 07/21/2018  ? Procedure: CESAREAN SECTION;  Surgeon: Adam Phenix, MD;  Location: [facility];  Service: Obstetrics;  Laterality: N/A;  ? NO PAST SURGERIES    ? ? ?OB History   ? ? Gravida  ?1  ? Para  ?1  ? Term  ?1  ? Preterm  ?0  ? AB   ?0  ? Living  ?1  ?  ? ? SAB  ?0  ? IAB  ?0  ? Ectopic  ?0  ? Multiple  ?0  ? Live Births  ?1  ?   ?  ?  ? ? ? ?Home Medications   ? ?Prior to Admission medications   ?Medication Sig Start Date End Date Taking? Authorizing Provider  ?metroNIDAZOLE (FLAGYL) 500 MG tablet Take 1 tablet (500 mg total) by mouth 2 (two) times daily. 06/25/21   LampteyBritta Mccreedy, MD  ?metroNIDAZOLE (METROGEL) 0.75 % vaginal gel Use one applicatorful per vagina Q HS x 5 days; start on day 4 of your menstrual cycle monthly for prevention. 06/23/21   Crain, Whitney L, PA  ?amLODipine (NORVASC) 2.5 MG tablet Take 1 tablet (2.5 mg total) by mouth daily. ?Patient not taking: Reported on 10/04/2018 08/15/18 09/28/20  Adam Phenix, MD  ?ferrous sulfate 325 (65 FE) MG tablet Take 1 tablet (325 mg total) by mouth daily with breakfast. 09/25/18 09/28/20  Adam Phenix, MD  ? ? ?Family History ?Family History  ?Problem Relation Age of Onset  ? Healthy Mother   ? Healthy Father   ? ? ?Social History ?Social History  ? ?Tobacco Use  ? Smoking status: Never  ? Smokeless tobacco: Never  ?Vaping Use  ? Vaping Use: Never used  ?Substance Use Topics  ?  Alcohol use: Never  ? Drug use: Never  ? ? ? ?Allergies   ?Patient has no known allergies. ? ? ?Review of Systems ?Review of Systems  ?Constitutional: Negative.   ?Cardiovascular: Negative.   ?Gastrointestinal: Negative.  Negative for abdominal pain.  ?Genitourinary:  Positive for vaginal discharge. Negative for bladder incontinence, dyspareunia and hesitancy.  ?Skin: Negative.   ?Psychiatric/Behavioral: Negative.    ? ? ?Physical Exam ?Triage Vital Signs ?ED Triage Vitals [07/31/21 1702]  ?Enc Vitals Group  ?   BP 98/64  ?   Pulse Rate 85  ?   Resp 16  ?   Temp   ?   Temp src   ?   SpO2 100 %  ?   Weight 108 lb 14.5 oz (49.4 kg)  ?   Height 5\' 2"  (1.575 m)  ?   Head Circumference   ?   Peak Flow   ?   Pain Score 0  ?   Pain Loc   ?   Pain Edu?   ?   Excl. in GC?   ? ?No data found. ? ?Updated Vital Signs ?BP  98/64 (BP Location: Left Arm)   Pulse 85   Resp 16   Ht 5\' 2"  (1.575 m)   Wt 108 lb 14.5 oz (49.4 kg)   SpO2 100%   BMI 19.92 kg/m?  ? ?Visual Acuity ?Right Eye Distance:   ?Left Eye Distance:   ?Bilateral Distance:   ? ?Right Eye Near:   ?Left Eye Near:    ?Bilateral Near:    ? ?Physical Exam ?Constitutional:   ?   Appearance: Normal appearance. She is normal weight.  ?HENT:  ?   Head: Normocephalic and atraumatic.  ?Cardiovascular:  ?   Rate and Rhythm: Normal rate and regular rhythm.  ?Pulmonary:  ?   Effort: Pulmonary effort is normal.  ?   Breath sounds: Normal breath sounds.  ?Abdominal:  ?   General: Bowel sounds are normal.  ?   Palpations: Abdomen is soft.  ?Neurological:  ?   Mental Status: She is alert and oriented to person, place, and time.  ?Psychiatric:     ?   Mood and Affect: Mood normal.     ?   Behavior: Behavior normal.  ? ? ? ?UC Treatments / Results  ?Labs ?(all labs ordered are listed, but only abnormal results are displayed) ?Labs Reviewed - No data to display ? ?EKG ? ? ?Radiology ?No results found. ? ?Procedures ?Procedures (including critical care time) ? ?Medications Ordered in UC ?Medications - No data to display ? ?Initial Impression / Assessment and Plan / UC Course  ?I have reviewed the triage vital signs and the nursing notes. ? ?Pertinent labs & imaging results that were available during my care of the patient were reviewed by me and considered in my medical decision making (see chart for details). ? ?Symptoms are consistent with bacterial vaginosis and yeast.  Will await cytology but will treat based on symptoms today.  Patient describes the discharge as thick and clumpy with a foul fishy smelling odor.  Patient also did not complete her previous medications for the same or similar symptoms.  She does not have any other symptoms at this time.  We will treat patient for both bacterial vaginosis and yeast with metronidazole and fluconazole.  Patient encouraged to use protection  with sexual intercourse, eat yogurt or probiotics to help prevent yeast infections.  Also eat a healthy diet.  Patient  provided instructions on how to take medications.  Patient advised that she will call if cytology results are positive.  Patient will follow-up if symptoms do not improve. ? ? ?Final Clinical Impressions(s) / UC Diagnoses  ? ?Final diagnoses:  ?None  ? ?Discharge Instructions   ?None ?  ? ?ED Prescriptions   ?None ?  ? ?PDMP not reviewed this encounter. ?  ?Abran Cantor, NP ?07/31/21 1742 ? ?

## 2021-07-31 NOTE — Discharge Instructions (Addendum)
You will be contacted when your cytology results are received if they are positive. ?Take medications as prescribed. ?Practice safe sex. ?Consider the use of probiotics or eating yogurt to help prevent symptoms of yeast infection. ?Follow-up if symptoms do not improve. ?

## 2021-08-02 LAB — CERVICOVAGINAL ANCILLARY ONLY
Bacterial Vaginitis (gardnerella): POSITIVE — AB
Candida Glabrata: NEGATIVE
Candida Vaginitis: NEGATIVE
Chlamydia: NEGATIVE
Comment: NEGATIVE
Comment: NEGATIVE
Comment: NEGATIVE
Comment: NEGATIVE
Comment: NEGATIVE
Comment: NORMAL
Neisseria Gonorrhea: NEGATIVE
Trichomonas: NEGATIVE

## 2021-08-21 ENCOUNTER — Other Ambulatory Visit: Payer: Self-pay

## 2021-08-21 ENCOUNTER — Ambulatory Visit (HOSPITAL_COMMUNITY)
Admission: EM | Admit: 2021-08-21 | Discharge: 2021-08-21 | Disposition: A | Discharge status: Home / Self Care | Payer: Medicaid Other

## 2021-09-13 ENCOUNTER — Encounter (HOSPITAL_COMMUNITY): Payer: Self-pay

## 2021-09-13 ENCOUNTER — Ambulatory Visit (HOSPITAL_COMMUNITY)
Admission: EM | Admit: 2021-09-13 | Discharge: 2021-09-13 | Disposition: A | Discharge status: Home / Self Care | Payer: Medicaid Other | Attending: Nurse Practitioner | Admitting: Nurse Practitioner

## 2021-09-13 DIAGNOSIS — N898 Other specified noninflammatory disorders of vagina: Secondary | ICD-10-CM

## 2021-09-13 MED ORDER — CLINDAMYCIN HCL 300 MG PO CAPS: 300.0000 mg | ORAL_CAPSULE | Freq: Two times a day (BID) | ORAL | 0 refills | Status: AC

## 2021-09-13 NOTE — ED Triage Notes (Signed)
Pt presents with c/o vaginal odor and discharge X 2 weeks.  ? ?Pt states the discharge and odor happened before her menstrual cycle.  ?

## 2021-09-13 NOTE — ED Provider Notes (Signed)
?[facility] ? ? ? ?CSN: [id number] ?Arrival date & time: 09/13/21  1856 ? ? ?  ? ?History   ?Chief Complaint ?Chief Complaint  ?Patient presents with  ? vaginal odor  ? Vaginal Discharge  ? ? ?HPI ?Angela Hayden is a 21 y.o. female.  ? ?Patient presents with vaginal discharge that has been ongoing since the beginning of this year.  She reports she is positive for bacterial vaginosis frequently.  The last time she has a positive, she did not take the treatment exactly as prescribed.  She reports her symptoms never got better after last time she was treated.  She denies any current fevers, nausea/vomiting, lower abdominal pain, swelling in her groin, dysuria, urinary frequency, hematuria.  She denies any sores, rashes, lesions on her genitalia.  She is sexually active, however does not think she has STI.  She is agreeable to gonorrhea, chlamydia, trichomonas testing, however declines HIV until this testing today. ? ?LMP was earlier this month. ? ? ?Past Medical History:  ?Diagnosis Date  ? Chlamydia trachomatis infection in pregnancy 07/20/2018  ? Iron deficiency anemia during pregnancy 07/20/2018  ? Medical history non-contributory   ? ? ?Patient Active Problem List  ? Diagnosis Date Noted  ? Postpartum anemia 08/09/2018  ? Postoperative fever 08/08/2018  ? Postpartum infection 08/08/2018  ? Nexplanon in place 07/23/2018  ? ? ?Past Surgical History:  ?Procedure Laterality Date  ? CESAREAN SECTION N/A 07/21/2018  ? Procedure: CESAREAN SECTION;  Surgeon: Adam Phenix, MD;  Location: [facility] BIRTHING SUIT[facility];  Service: Obstetrics;  Laterality: N/A;  ? NO PAST SURGERIES    ? ? ?OB History   ? ? Gravida  ?1  ? Para  ?1  ? Term  ?1  ? Preterm  ?0  ? AB  ?0  ? Living  ?1  ?  ? ? SAB  ?0  ? IAB  ?0  ? Ectopic  ?0  ? Multiple  ?0  ? Live Births  ?1  ?   ?  ?  ? ? ? ?Home Medications   ? ?Prior to Admission medications   ?Medication Sig Start Date End Date Taking? Authorizing Provider  ?clindamycin (CLEOCIN) 300 MG  capsule Take 1 capsule (300 mg total) by mouth in the morning and at bedtime for 7 days. 09/13/21 09/20/21 Yes Valentino Nose, NP  ?amLODipine (NORVASC) 2.5 MG tablet Take 1 tablet (2.5 mg total) by mouth daily. ?Patient not taking: Reported on 10/04/2018 08/15/18 09/28/20  Adam Phenix, MD  ?ferrous sulfate 325 (65 FE) MG tablet Take 1 tablet (325 mg total) by mouth daily with breakfast. 09/25/18 09/28/20  Adam Phenix, MD  ? ? ?Family History ?Family History  ?Problem Relation Age of Onset  ? Healthy Mother   ? Healthy Father   ? ? ?Social History ?Social History  ? ?Tobacco Use  ? Smoking status: Never  ? Smokeless tobacco: Never  ?Vaping Use  ? Vaping Use: Never used  ?Substance Use Topics  ? Alcohol use: Never  ? Drug use: Never  ? ? ? ?Allergies   ?Patient has no known allergies. ? ? ?Review of Systems ?Review of Systems ?Per HPI ? ?Physical Exam ?Triage Vital Signs ?ED Triage Vitals  ?Enc Vitals Group  ?   BP 09/13/21 1944 108/77  ?   Pulse Rate 09/13/21 1943 93  ?   Resp 09/13/21 1943 17  ?   Temp 09/13/21 1943 (!) 97.4 ?F (36.3 ?C)  ?  Temp Source 09/13/21 1943 Oral  ?   SpO2 09/13/21 1943 99 %  ?   Weight --   ?   Height --   ?   Head Circumference --   ?   Peak Flow --   ?   Pain Score 09/13/21 1942 0  ?   Pain Loc --   ?   Pain Edu? --   ?   Excl. in GC? --   ? ?No data found. ? ?Updated Vital Signs ?BP 108/77 (BP Location: Right Arm)   Pulse 93   Temp (!) 97.4 ?F (36.3 ?C) (Oral)   Resp 17   LMP 09/05/2021 (Exact Date)   SpO2 99%  ? ?Visual Acuity ?Right Eye Distance:   ?Left Eye Distance:   ?Bilateral Distance:   ? ?Right Eye Near:   ?Left Eye Near:    ?Bilateral Near:    ? ?Physical Exam ?Vitals and nursing note reviewed.  ?Constitutional:   ?   General: She is not in acute distress. ?   Appearance: Normal appearance. She is not toxic-appearing.  ?Genitourinary: ?   Comments: Deferred ?Skin: ?   General: Skin is warm and dry.  ?   Coloration: Skin is not jaundiced or pale.  ?   Findings: No  erythema.  ?Neurological:  ?   Mental Status: She is alert and oriented to person, place, and time.  ?   Motor: No weakness.  ?   Gait: Gait normal.  ?Psychiatric:     ?   Mood and Affect: Mood normal.     ?   Behavior: Behavior is cooperative.  ? ? ? ?UC Treatments / Results  ?Labs ?(all labs ordered are listed, but only abnormal results are displayed) ?Labs Reviewed  ?CERVICOVAGINAL ANCILLARY ONLY  ? ? ?EKG ? ? ?Radiology ?No results found. ? ?Procedures ?Procedures (including critical care time) ? ?Medications Ordered in UC ?Medications - No data to display ? ?Initial Impression / Assessment and Plan / UC Course  ?I have reviewed the triage vital signs and the nursing notes. ? ?Pertinent labs & imaging results that were available during my care of the patient were reviewed by me and considered in my medical decision making (see chart for details). ? ?  ?Check self swab for bacterial vaginosis, yeast vaginitis, gonorrhea, chlamydia, trichomonas.  Patient desires treatment for BV-we will empirically treat with clindamycin 300 mg twice daily for 7 days for bacterial vaginosis.  No sexual activity until she is fully treated and self-swab returns.   ?Final Clinical Impressions(s) / UC Diagnoses  ? ?Final diagnoses:  ?Vaginal discharge  ? ? ? ?Discharge Instructions   ? ?  ?- We will let you know if the self swab comes back showing a different infection than bacterial vaginosis ?-Please start the clindamycin 300 mg twice daily for 7 days for possible bacterial vaginosis ?-If you start having diarrhea or abdominal pain with this medicine, please stop it and notify us ? ? ? ?ED Prescriptions   ? ? Medication Sig Dispense Auth. Provider  ? clindamycin (CLEOCIN) 300 MG capsule Take 1 capsule (300 mg total) by mouth in the morning and at bedtime for 7 days. 14 capsule Valentino Nose, NP  ? ?  ? ?PDMP not reviewed this encounter. ?  ?Valentino Nose, NP ?09/13/21 2006 ? ?

## 2021-09-13 NOTE — Discharge Instructions (Signed)
-   We will let you know if the self swab comes back showing a different infection than bacterial vaginosis ?-Please start the clindamycin 300 mg twice daily for 7 days for possible bacterial vaginosis ?-If you start having diarrhea or abdominal pain with this medicine, please stop it and notify us ?

## 2021-09-15 LAB — CERVICOVAGINAL ANCILLARY ONLY
Bacterial Vaginitis (gardnerella): POSITIVE — AB
Candida Glabrata: NEGATIVE
Candida Vaginitis: NEGATIVE
Chlamydia: NEGATIVE
Comment: NEGATIVE
Comment: NEGATIVE
Comment: NEGATIVE
Comment: NEGATIVE
Comment: NEGATIVE
Comment: NORMAL
Neisseria Gonorrhea: NEGATIVE
Trichomonas: NEGATIVE

## 2021-11-10 ENCOUNTER — Encounter (HOSPITAL_COMMUNITY): Payer: Self-pay

## 2021-11-10 ENCOUNTER — Ambulatory Visit (HOSPITAL_COMMUNITY)
Admission: RE | Admit: 2021-11-10 | Discharge: 2021-11-10 | Disposition: A | Discharge status: Home / Self Care | Payer: Medicaid Other | Source: Ambulatory Visit | Attending: Family Medicine | Admitting: Family Medicine

## 2021-11-10 VITALS — BP 103/67 | HR 95 | Temp 98.1°F | Resp 16 | Ht 62.0 in | Wt 110.0 lb

## 2021-11-10 DIAGNOSIS — N3090 Cystitis, unspecified without hematuria: Secondary | ICD-10-CM | POA: Insufficient documentation

## 2021-11-10 DIAGNOSIS — N3091 Cystitis, unspecified with hematuria: Secondary | ICD-10-CM | POA: Diagnosis not present

## 2021-11-10 DIAGNOSIS — N898 Other specified noninflammatory disorders of vagina: Secondary | ICD-10-CM | POA: Diagnosis not present

## 2021-11-10 DIAGNOSIS — R310 Gross hematuria: Secondary | ICD-10-CM | POA: Diagnosis present

## 2021-11-10 LAB — POCT URINALYSIS DIPSTICK, ED / UC
Glucose, UA: NEGATIVE mg/dL
Ketones, ur: 15 mg/dL — AB
Nitrite: POSITIVE — AB
Protein, ur: >=300 mg/dL — AB
Specific Gravity, Urine: 1.02 (ref 1.005–1.030)
Urobilinogen, UA: 4 mg/dL — ABNORMAL HIGH (ref 0.0–1.0)
pH: 7 (ref 5.0–8.0)

## 2021-11-10 MED ORDER — CEPHALEXIN 500 MG PO CAPS: 500.0000 mg | ORAL_CAPSULE | Freq: Two times a day (BID) | ORAL | 0 refills | Status: DC

## 2021-11-10 MED ORDER — METRONIDAZOLE 500 MG PO TABS: 500.0000 mg | ORAL_TABLET | Freq: Two times a day (BID) | ORAL | 0 refills | Status: DC

## 2021-11-10 NOTE — ED Provider Notes (Signed)
[facility]   [id number] 11/10/21 Arrival Time: 1743  ASSESSMENT & PLAN:  1. Cystitis   2. Gross hematuria   3. Vaginal discharge      Discharge Instructions      In addition to a urine culture, we have sent testing for various causes of vaginal infections. We will notify you of any positive results once they are received. If required, we will prescribe any medications you might need.  Begin the following for treatment of a bladder infection and possible bacterial vaginosis: Meds ordered this encounter  Medications   cephALEXin (KEFLEX) 500 MG capsule    Sig: Take 1 capsule (500 mg total) by mouth 2 (two) times daily.    Dispense:  10 capsule    Refill:  0   metroNIDAZOLE (FLAGYL) 500 MG tablet    Sig: Take 1 tablet (500 mg total) by mouth 2 (two) times daily.    Dispense:  14 tablet    Refill:  0     Without s/s of PID.  Labs Reviewed  POCT URINALYSIS DIPSTICK, ED / UC - Abnormal; Notable for the following components:      Result Value   Bilirubin Urine MODERATE (*)    Ketones, ur 15 (*)    Hgb urine dipstick LARGE (*)    Protein, ur >=300 (*)    Urobilinogen, UA 4.0 (*)    Nitrite POSITIVE (*)    Leukocytes,Ua LARGE (*)    All other components within normal limits  CERVICOVAGINAL ANCILLARY ONLY   Urine culture pending.  Will notify of any positive results. Instructed to refrain from sexual activity for at least seven days.  Reviewed expectations re: course of current medical issues. Questions answered. Outlined signs and symptoms indicating need for more acute intervention. Patient verbalized understanding. After Visit Summary given.   SUBJECTIVE:  Angela Hayden is a 21 y.o. female who presents with complaint of vaginal discharge and gross hematuria. Discharge noted few d ago after menstrual period. H/O BV with same discharge. Hematuria noted today; mild dysuria; mild freq. No specific aggravating or alleviating factors reported. Afebrile. No  abdominal or pelvic pain. Normal PO intake wihout n/v. No genital rashes or lesions. No abd pain.  Patient's last menstrual period was 11/01/2021 (approximate).   OBJECTIVE:  Vitals:   11/10/21 1805 11/10/21 1807  BP: 103/67   Pulse: 95   Resp: 16   Temp: 98.1 F (36.7 C)   TempSrc: Oral   SpO2: 100%   Weight:  49.9 kg  Height:  5\' 2"  (1.575 m)    General appearance: alert, cooperative, appears stated age and no distress Lungs: unlabored respirations; speaks full sentences without difficulty Back: no CVA tenderness; FROM at waist Abdomen: soft, non-tender GU: deferred Skin: warm and dry Psychological: alert and cooperative; normal mood and affect.  Results for orders placed or performed during the hospital encounter of 11/10/21  POC Urinalysis dipstick  Result Value Ref Range   Glucose, UA NEGATIVE NEGATIVE mg/dL   Bilirubin Urine MODERATE (A) NEGATIVE   Ketones, ur 15 (A) NEGATIVE mg/dL   Specific Gravity, Urine 1.020 1.005 - 1.030   Hgb urine dipstick LARGE (A) NEGATIVE   pH 7.0 5.0 - 8.0   Protein, ur >=300 (A) NEGATIVE mg/dL   Urobilinogen, UA 4.0 (H) 0.0 - 1.0 mg/dL   Nitrite POSITIVE (A) NEGATIVE   Leukocytes,Ua LARGE (A) NEGATIVE    Labs Reviewed  POCT URINALYSIS DIPSTICK, ED / UC - Abnormal; Notable for the following components:  Result Value   Bilirubin Urine MODERATE (*)    Ketones, ur 15 (*)    Hgb urine dipstick LARGE (*)    Protein, ur >=300 (*)    Urobilinogen, UA 4.0 (*)    Nitrite POSITIVE (*)    Leukocytes,Ua LARGE (*)    All other components within normal limits  CERVICOVAGINAL ANCILLARY ONLY    No Known Allergies  Past Medical History:  Diagnosis Date   Chlamydia trachomatis infection in pregnancy 07/20/2018   Iron deficiency anemia during pregnancy 07/20/2018   Medical history non-contributory    Family History  Problem Relation Age of Onset   Healthy Mother    Healthy Father    Social History   Socioeconomic History    Marital status: Single    Spouse name: Not on file   Number of children: Not on file   Years of education: Not on file   Highest education level: Not on file  Occupational History   Not on file  Tobacco Use   Smoking status: Some Days    Types: Cigarettes   Smokeless tobacco: Never  Vaping Use   Vaping Use: Never used  Substance and Sexual Activity   Alcohol use: Never   Drug use: Never   Sexual activity: Yes    Birth control/protection: None    Comment: last IC-5-6 mo ago  Other Topics Concern   Not on file  Social History Narrative   Not on file   Social Determinants of Health   Financial Resource Strain: Not on file  Food Insecurity: Not on file  Transportation Needs: Not on file  Physical Activity: Not on file  Stress: Not on file  Social Connections: Not on file  Intimate Partner Violence: Not on file           [place], MD 11/10/21 [phone]

## 2021-11-10 NOTE — ED Triage Notes (Signed)
Patient having vaginal odor, discharge, and blood in the urine. Patient states her mensis ended 3 days ago. No burning with urination but urinary urgency with decreased output and pain. Pain onset today while in waiting room bathroom. Discharge/odor for 2 days.

## 2021-11-10 NOTE — Discharge Instructions (Signed)
In addition to a urine culture, we have sent testing for various causes of vaginal infections. We will notify you of any positive results once they are received. If required, we will prescribe any medications you might need.  Begin the following for treatment of a bladder infection and possible bacterial vaginosis: Meds ordered this encounter  Medications   cephALEXin (KEFLEX) 500 MG capsule    Sig: Take 1 capsule (500 mg total) by mouth 2 (two) times daily.    Dispense:  10 capsule    Refill:  0   metroNIDAZOLE (FLAGYL) 500 MG tablet    Sig: Take 1 tablet (500 mg total) by mouth 2 (two) times daily.    Dispense:  14 tablet    Refill:  0

## 2021-11-11 LAB — CERVICOVAGINAL ANCILLARY ONLY
Bacterial Vaginitis (gardnerella): POSITIVE — AB
Candida Glabrata: NEGATIVE
Candida Vaginitis: NEGATIVE
Chlamydia: NEGATIVE
Comment: NEGATIVE
Comment: NEGATIVE
Comment: NEGATIVE
Comment: NEGATIVE
Comment: NEGATIVE
Comment: NORMAL
Neisseria Gonorrhea: NEGATIVE
Trichomonas: NEGATIVE

## 2021-11-12 LAB — URINE CULTURE: Culture: >=100000 — AB

## 2021-11-16 ENCOUNTER — Encounter (HOSPITAL_COMMUNITY): Payer: Self-pay

## 2021-11-16 ENCOUNTER — Ambulatory Visit (HOSPITAL_COMMUNITY)
Admission: RE | Admit: 2021-11-16 | Discharge: 2021-11-16 | Disposition: A | Discharge status: Home / Self Care | Payer: Medicaid Other | Source: Ambulatory Visit | Attending: Emergency Medicine | Admitting: Emergency Medicine

## 2021-11-16 VITALS — BP 102/69 | HR 86 | Temp 98.4°F | Resp 16 | Ht 62.0 in | Wt 110.0 lb

## 2021-11-16 DIAGNOSIS — K047 Periapical abscess without sinus: Secondary | ICD-10-CM

## 2021-11-16 MED ORDER — AMOXICILLIN-POT CLAVULANATE 875-125 MG PO TABS: 1.0000 | ORAL_TABLET | Freq: Two times a day (BID) | ORAL | 0 refills | Status: DC

## 2021-11-16 MED ORDER — FLUCONAZOLE 150 MG PO TABS: 150.0000 mg | ORAL_TABLET | Freq: Every day | ORAL | 0 refills | Status: AC

## 2021-11-16 NOTE — Discharge Instructions (Signed)
Today you are being treated for abscess on your left upper gumline, abscesses or pockets of infection  Begin use of Augmentin every morning and every evening for 7 days, ideally should start to see improvement in about 48 hours and steady progression from there  As you are currently taking 3 antibiotics for very infections, Diflucan has been sent to the pharmacy in case you begin to have symptoms of a yeast infection  You may continue use of ibuprofen as well as Tylenol for comfort  May attempt salt water gargles, throat lozenges warm liquids for additional support  Please schedule an appointment with your dentist ideally for next week so they can evaluate your tooth  May follow-up with this urgent care as needed

## 2021-11-16 NOTE — ED Provider Notes (Signed)
[facility]    CSN: [id number] Arrival date & time: 11/16/21  1726      History   Chief Complaint Chief Complaint  Patient presents with   Dental Pain   Abscess    HPI Angela Hayden is a 21 y.o. female.   Patient presents with abscess to the left upper gumline beginning 2 days ago.  Endorses that there is a broken tooth at the site of the abscess.  Has noted drainage to the site.  Has attempted use of ibuprofen which has been somewhat helpful.  Is established with a dentist but has not notified office.  Denies difficulty swallowing, difficulty chewing, fever or chills.    Past Medical History:  Diagnosis Date   Chlamydia trachomatis infection in pregnancy 07/20/2018   Iron deficiency anemia during pregnancy 07/20/2018   Medical history non-contributory     Patient Active Problem List   Diagnosis Date Noted   Postpartum anemia 08/09/2018   Postoperative fever 08/08/2018   Postpartum infection 08/08/2018   Nexplanon in place 07/23/2018    Past Surgical History:  Procedure Laterality Date   CESAREAN SECTION N/A 07/21/2018   Procedure: CESAREAN SECTION;  Surgeon: Adam Phenix, MD;  Location: [facility];  Service: Obstetrics;  Laterality: N/A;   NO PAST SURGERIES      OB History     Gravida  1   Para  1   Term  1   Preterm  0   AB  0   Living  1      SAB  0   IAB  0   Ectopic  0   Multiple  0   Live Births  1            Home Medications    Prior to Admission medications   Medication Sig Start Date End Date Taking? Authorizing Provider  cephALEXin (KEFLEX) 500 MG capsule Take 1 capsule (500 mg total) by mouth 2 (two) times daily. 11/10/21   Mardella Layman, MD  metroNIDAZOLE (FLAGYL) 500 MG tablet Take 1 tablet (500 mg total) by mouth 2 (two) times daily. 11/10/21   Mardella Layman, MD  amLODipine (NORVASC) 2.5 MG tablet Take 1 tablet (2.5 mg total) by mouth daily. Patient not taking: Reported on 10/04/2018 08/15/18 09/28/20   Adam Phenix, MD  ferrous sulfate 325 (65 FE) MG tablet Take 1 tablet (325 mg total) by mouth daily with breakfast. 09/25/18 09/28/20  Adam Phenix, MD    Family History Family History  Problem Relation Age of Onset   Healthy Mother    Healthy Father     Social History Social History   Tobacco Use   Smoking status: Some Days    Types: Cigarettes   Smokeless tobacco: Never  Vaping Use   Vaping Use: Never used  Substance Use Topics   Alcohol use: Never   Drug use: Never     Allergies   Patient has no known allergies.   Review of Systems Review of Systems  Constitutional: Negative.   HENT:  Positive for dental problem. Negative for congestion, drooling, ear discharge, ear pain, facial swelling, hearing loss, mouth sores, nosebleeds, postnasal drip, rhinorrhea, sinus pressure, sinus pain, sneezing, sore throat, tinnitus, trouble swallowing and voice change.   Eyes: Negative.   Respiratory: Negative.    Cardiovascular: Negative.   Skin: Negative.   Neurological: Negative.      Physical Exam Triage Vital Signs ED Triage Vitals  Enc Vitals Group  BP 11/16/21 1812 102/69     Pulse Rate 11/16/21 1812 86     Resp 11/16/21 1812 16     Temp 11/16/21 1812 98.4 F (36.9 C)     Temp Source 11/16/21 1812 Oral     SpO2 11/16/21 1812 98 %     Weight 11/16/21 1811 110 lb (49.9 kg)     Height 11/16/21 1811 5\' 2"  (1.575 m)     Head Circumference --      Peak Flow --      Pain Score 11/16/21 1811 6     Pain Loc --      Pain Edu? --      Excl. in GC? --    No data found.  Updated Vital Signs BP 102/69 (BP Location: Right Arm)   Pulse 86   Temp 98.4 F (36.9 C) (Oral)   Resp 16   Ht 5\' 2"  (1.575 m)   Wt 110 lb (49.9 kg)   LMP 11/01/2021 (Approximate)   SpO2 98%   BMI 20.12 kg/m   Visual Acuity Right Eye Distance:   Left Eye Distance:   Bilateral Distance:    Right Eye Near:   Left Eye Near:    Bilateral Near:     Physical Exam Constitutional:       Appearance: Normal appearance.  HENT:     Head: Normocephalic.     Mouth/Throat:     Comments: 0.5 cm abscess noted to the left upper gumline with broken tooth and dental decay adjacent, mild to moderate gingival swelling Eyes:     Extraocular Movements: Extraocular movements intact.  Pulmonary:     Effort: Pulmonary effort is normal.  Skin:    General: Skin is warm and dry.  Neurological:     Mental Status: She is alert and oriented to person, place, and time. Mental status is at baseline.  Psychiatric:        Mood and Affect: Mood normal.        Behavior: Behavior normal.      UC Treatments / Results  Labs (all labs ordered are listed, but only abnormal results are displayed) Labs Reviewed - No data to display  EKG   Radiology No results found.  Procedures Procedures (including critical care time)  Medications Ordered in UC Medications - No data to display  Initial Impression / Assessment and Plan / UC Course  I have reviewed the triage vital signs and the nursing notes.  Pertinent labs & imaging results that were available during my care of the patient were reviewed by me and considered in my medical decision making (see chart for details).  Dental abscess  Augmentin prescribed for 7 days, Diflucan sent prophylactically as patient is currently taking 3 antibiotics, recommended continued use of ibuprofen, may use Tylenol in addition as well as salt water gargles, Listerine gargles and oral gel for additional support, advised patient to attempt to schedule dental appointment for next week for further evaluation management, may follow-up with this urgent care as needed Final Clinical Impressions(s) / UC Diagnoses   Final diagnoses:  None   Discharge Instructions   None    ED Prescriptions   None    PDMP not reviewed this encounter.   , 01/01/2022 11/16/21 [phone]

## 2021-11-16 NOTE — ED Triage Notes (Signed)
Abcess on the top left side of the mouth for 2 days. Patient states is growing and getting worse.

## 2021-11-29 ENCOUNTER — Encounter (HOSPITAL_COMMUNITY): Payer: Self-pay

## 2021-11-29 ENCOUNTER — Ambulatory Visit (HOSPITAL_COMMUNITY)
Admission: RE | Admit: 2021-11-29 | Discharge: 2021-11-29 | Disposition: A | Discharge status: Home / Self Care | Payer: Medicaid Other | Source: Ambulatory Visit | Attending: Emergency Medicine | Admitting: Emergency Medicine

## 2021-11-29 VITALS — BP 114/76 | HR 73 | Temp 98.2°F | Resp 16

## 2021-11-29 DIAGNOSIS — N898 Other specified noninflammatory disorders of vagina: Secondary | ICD-10-CM | POA: Diagnosis present

## 2021-11-29 LAB — POCT URINALYSIS DIPSTICK, ED / UC
Bilirubin Urine: NEGATIVE
Glucose, UA: NEGATIVE mg/dL
Hgb urine dipstick: NEGATIVE
Ketones, ur: NEGATIVE mg/dL
Leukocytes,Ua: NEGATIVE
Nitrite: NEGATIVE
Protein, ur: NEGATIVE mg/dL
Specific Gravity, Urine: 1.02 (ref 1.005–1.030)
Urobilinogen, UA: 0.2 mg/dL (ref 0.0–1.0)
pH: 7 (ref 5.0–8.0)

## 2021-11-29 MED ORDER — METRONIDAZOLE 500 MG PO TABS: 500.0000 mg | ORAL_TABLET | Freq: Two times a day (BID) | ORAL | 0 refills | Status: DC

## 2021-11-29 NOTE — ED Provider Notes (Signed)
[facility]    CSN: [id number] Arrival date & time: 11/29/21  1544      History   Chief Complaint Chief Complaint  Patient presents with   Vaginal Discharge   Appointment    1600    HPI Angela Hayden is a 21 y.o. female.   Patient presents with thin Angela Hayden vaginal discharge with odor and dysuria for 3 days.  Was recently treated for bacterial vaginosis and a urine infection but did not complete medications.  Did have sex within that timeframe as well with current partner, no condom use.  Denies urinary frequency, urgency, lower abdominal pressure, flank pain, fever, chills, new rash or lesions, vaginal itching.  Menstruating.  Past Medical History:  Diagnosis Date   Chlamydia trachomatis infection in pregnancy 07/20/2018   Iron deficiency anemia during pregnancy 07/20/2018    Patient Active Problem List   Diagnosis Date Noted   Postpartum anemia 08/09/2018   Postoperative fever 08/08/2018   Postpartum infection 08/08/2018   Nexplanon in place 07/23/2018    Past Surgical History:  Procedure Laterality Date   CESAREAN SECTION N/A 07/21/2018   Procedure: CESAREAN SECTION;  Surgeon: Adam Phenix, MD;  Location: [facility] BIRTHING SUITES;  Service: Obstetrics;  Laterality: N/A;    OB History     Gravida  1   Para  1   Term  1   Preterm  0   AB  0   Living  1      SAB  0   IAB  0   Ectopic  0   Multiple  0   Live Births  1            Home Medications    Prior to Admission medications   Medication Sig Start Date End Date Taking? Authorizing Provider  amoxicillin-clavulanate (AUGMENTIN) 875-125 MG tablet Take 1 tablet by mouth every 12 (twelve) hours. 11/16/21   Patryk Conant, Elita Boone, NP  cephALEXin (KEFLEX) 500 MG capsule Take 1 capsule (500 mg total) by mouth 2 (two) times daily. 11/10/21   Mardella Layman, MD  metroNIDAZOLE (FLAGYL) 500 MG tablet Take 1 tablet (500 mg total) by mouth 2 (two) times daily. 11/29/21   Charlotte Fidalgo, Elita Boone, NP   amLODipine (NORVASC) 2.5 MG tablet Take 1 tablet (2.5 mg total) by mouth daily. Patient not taking: Reported on 10/04/2018 08/15/18 09/28/20  Adam Phenix, MD  ferrous sulfate 325 (65 FE) MG tablet Take 1 tablet (325 mg total) by mouth daily with breakfast. 09/25/18 09/28/20  Adam Phenix, MD    Family History Family History  Problem Relation Age of Onset   Healthy Mother    Healthy Father     Social History Social History   Tobacco Use   Smoking status: Never   Smokeless tobacco: Never  Vaping Use   Vaping Use: Never used  Substance Use Topics   Alcohol use: Yes    Comment: occasionally   Drug use: Yes    Types: Marijuana     Allergies   Patient has no known allergies.   Review of Systems Review of Systems  Constitutional: Negative.   Genitourinary:  Positive for dysuria and vaginal discharge. Negative for decreased urine volume, difficulty urinating, dyspareunia, enuresis, flank pain, frequency, genital sores, hematuria, menstrual problem, pelvic pain, urgency, vaginal bleeding and vaginal pain.  Skin: Negative.      Physical Exam Triage Vital Signs ED Triage Vitals  Enc Vitals Group     BP 11/29/21 1610 114/76  Pulse Rate 11/29/21 1610 73     Resp 11/29/21 1610 16     Temp 11/29/21 1610 98.2 F (36.8 C)     Temp Source 11/29/21 1610 Oral     SpO2 11/29/21 1610 99 %     Weight --      Height --      Head Circumference --      Peak Flow --      Pain Score 11/29/21 1611 0     Pain Loc --      Pain Edu? --      Excl. in GC? --    No data found.  Updated Vital Signs BP 114/76   Pulse 73   Temp 98.2 F (36.8 C) (Oral)   Resp 16   LMP 11/27/2021 (Exact Date)   SpO2 99%   Visual Acuity Right Eye Distance:   Left Eye Distance:   Bilateral Distance:    Right Eye Near:   Left Eye Near:    Bilateral Near:     Physical Exam Constitutional:      Appearance: Normal appearance.  Eyes:     Extraocular Movements: Extraocular movements intact.   Pulmonary:     Effort: Pulmonary effort is normal.  Genitourinary:    Comments: Deferred Skin:    General: Skin is warm and dry.  Neurological:     Mental Status: She is alert and oriented to person, place, and time. Mental status is at baseline.  Psychiatric:        Mood and Affect: Mood normal.        Behavior: Behavior normal.      UC Treatments / Results  Labs (all labs ordered are listed, but only abnormal results are displayed) Labs Reviewed  POCT URINALYSIS DIPSTICK, ED / UC  CERVICOVAGINAL ANCILLARY ONLY    EKG   Radiology No results found.  Procedures Procedures (including critical care time)  Medications Ordered in UC Medications - No data to display  Initial Impression / Assessment and Plan / UC Course  I have reviewed the triage vital signs and the nursing notes.  Pertinent labs & imaging results that were available during my care of the patient were reviewed by me and considered in my medical decision making (see chart for details).  Vaginal discharge  Presentation is consistent with BV and patient did not complete last medication course, prophylactically will treat as needed, metronidazole sent to pharmacy, advised abstinence while using medication and till labs results, treatment is complete and all symptoms have resolved, STI labs are pending, will treat remaining infections per protocol, may follow-up with this urgent care as needed if symptoms persist Final Clinical Impressions(s) / UC Diagnoses   Final diagnoses:  Vaginal discharge     Discharge Instructions      Today you are being treated prophylactically for  Bacterial vaginosis   Urinalysis is negative  Take Metronidazole 500 mg twice a day for 7 days, do not drink alcohol while using medication, this will make you feel sick , take all medicine or your discharge will return  Bacterial vaginosis which results from an overgrowth of one on several organisms that are normally present in  your vagina. Vaginosis is an inflammation of the vagina that can result in discharge, itching and pain.  Labs pending 2-3 days, you will be contacted if positive for any sti and treatment will be sent to the pharmacy, you will have to return to the clinic if positive for gonorrhea to receive  treatment   Please refrain from having sex until labs results, if positive please refrain from having sex until treatment complete and symptoms resolve   If positive for Chlamydia  gonorrhea or trichomoniasis please notify partner or partners so they may tested as well  Moving forward, it is recommended you use some form of protection against the transmission of sti infections  such as condoms or dental dams with each sexual encounter     In addition: Avoid baths, hot tubs and whirlpool spas.  Don't use scented or harsh soaps Avoid irritants. These include scented tampons and pads. Wipe from front to back after using the toilet. Don't douche. Your vagina doesn't require cleansing other than normal bathing.  Use a condom.  Wear cotton underwear, this fabric absorbs some moisture.        ED Prescriptions     Medication Sig Dispense Auth. Provider   metroNIDAZOLE (FLAGYL) 500 MG tablet Take 1 tablet (500 mg total) by mouth 2 (two) times daily. 14 tablet Khizar Fiorella, Elita Boone, NP      PDMP not reviewed this encounter.   Valinda Hoar, NP 11/29/21 1659

## 2021-11-29 NOTE — ED Triage Notes (Signed)
Pt states was recently treated for BV and cystitis; states "I was bad and didn't finish all the pills". States all sxs cleared up, but started back with vaginal discharge 3 days ago with dysuria.

## 2021-11-29 NOTE — Discharge Instructions (Signed)
Today you are being treated prophylactically for  Bacterial vaginosis   Urinalysis is negative  Take Metronidazole 500 mg twice a day for 7 days, do not drink alcohol while using medication, this will make you feel sick , take all medicine or your discharge will return  Bacterial vaginosis which results from an overgrowth of one on several organisms that are normally present in your vagina. Vaginosis is an inflammation of the vagina that can result in discharge, itching and pain.  Labs pending 2-3 days, you will be contacted if positive for any sti and treatment will be sent to the pharmacy, you will have to return to the clinic if positive for gonorrhea to receive treatment   Please refrain from having sex until labs results, if positive please refrain from having sex until treatment complete and symptoms resolve   If positive for Chlamydia  gonorrhea or trichomoniasis please notify partner or partners so they may tested as well  Moving forward, it is recommended you use some form of protection against the transmission of sti infections  such as condoms or dental dams with each sexual encounter     In addition: Avoid baths, hot tubs and whirlpool spas.  Don't use scented or harsh soaps Avoid irritants. These include scented tampons and pads. Wipe from front to back after using the toilet. Don't douche. Your vagina doesn't require cleansing other than normal bathing.  Use a condom.  Wear cotton underwear, this fabric absorbs some moisture.

## 2021-12-01 LAB — CERVICOVAGINAL ANCILLARY ONLY
Bacterial Vaginitis (gardnerella): POSITIVE — AB
Candida Glabrata: NEGATIVE
Candida Vaginitis: NEGATIVE
Chlamydia: NEGATIVE
Comment: NEGATIVE
Comment: NEGATIVE
Comment: NEGATIVE
Comment: NEGATIVE
Comment: NEGATIVE
Comment: NORMAL
Neisseria Gonorrhea: NEGATIVE
Trichomonas: NEGATIVE

## 2021-12-12 ENCOUNTER — Encounter (HOSPITAL_COMMUNITY): Payer: Self-pay

## 2021-12-12 ENCOUNTER — Ambulatory Visit (HOSPITAL_COMMUNITY)
Admission: EM | Admit: 2021-12-12 | Discharge: 2021-12-12 | Disposition: A | Discharge status: Home / Self Care | Payer: Medicaid Other | Attending: Internal Medicine | Admitting: Internal Medicine

## 2021-12-12 DIAGNOSIS — N898 Other specified noninflammatory disorders of vagina: Secondary | ICD-10-CM | POA: Insufficient documentation

## 2021-12-12 DIAGNOSIS — Z113 Encounter for screening for infections with a predominantly sexual mode of transmission: Secondary | ICD-10-CM | POA: Insufficient documentation

## 2021-12-12 MED ORDER — CLINDAMYCIN HCL 150 MG PO CAPS: 300.0000 mg | ORAL_CAPSULE | Freq: Two times a day (BID) | ORAL | 0 refills | Status: AC

## 2021-12-12 NOTE — Discharge Instructions (Signed)
Vaginal swab is pending.  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  You are being treated with clindamycin antibiotic and please be sure that you take this medication with food.  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

## 2021-12-12 NOTE — ED Provider Notes (Signed)
[facility]    CSN: [id number] Arrival date & time: 12/12/21  1630      History   Chief Complaint Chief Complaint  Patient presents with   Vaginal Discharge    HPI Angela Hayden is a 21 y.o. female.   Patient presents for vaginal discharge that has been present for about 1 day.  Denies any associated dysuria, urinary burning, abdominal pain, pelvic pain, back pain, fever, hematuria, abnormal vaginal bleeding.  Patient states that she just completed her menstrual cycle a few days prior.  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  She was seen a few weeks prior and treated with metronidazole.  She states she took 6 days worth of medication with resolution of symptoms.   Vaginal Discharge   Past Medical History:  Diagnosis Date   Chlamydia trachomatis infection in pregnancy 07/20/2018   Iron deficiency anemia during pregnancy 07/20/2018    Patient Active Problem List   Diagnosis Date Noted   Postpartum anemia 08/09/2018   Postoperative fever 08/08/2018   Postpartum infection 08/08/2018   Nexplanon in place 07/23/2018    Past Surgical History:  Procedure Laterality Date   CESAREAN SECTION N/A 07/21/2018   Procedure: CESAREAN SECTION;  Surgeon: Adam Phenix, MD;  Location: [facility] BIRTHING SUITES;  Service: Obstetrics;  Laterality: N/A;    OB History     Gravida  1   Para  1   Term  1   Preterm  0   AB  0   Living  1      SAB  0   IAB  0   Ectopic  0   Multiple  0   Live Births  1            Home Medications    Prior to Admission medications   Medication Sig Start Date End Date Taking? Authorizing Provider  clindamycin (CLEOCIN) 150 MG capsule Take 2 capsules (300 mg total) by mouth in the morning and at bedtime for 7 days. 12/12/21 12/19/21 Yes Sita Mangen, Acie Fredrickson, FNP  amLODipine (NORVASC) 2.5 MG tablet Take 1 tablet (2.5 mg total) by mouth daily. Patient not taking: Reported on 10/04/2018 08/15/18 09/28/20  Adam Phenix, MD  ferrous sulfate 325 (65 FE) MG tablet Take 1 tablet (325 mg total) by mouth daily with breakfast. 09/25/18 09/28/20  Adam Phenix, MD    Family History Family History  Problem Relation Age of Onset   Healthy Mother    Healthy Father     Social History Social History   Tobacco Use   Smoking status: Never   Smokeless tobacco: Never  Vaping Use   Vaping Use: Never used  Substance Use Topics   Alcohol use: Yes    Comment: occasionally   Drug use: Yes    Types: Marijuana     Allergies   Patient has no known allergies.   Review of Systems Review of Systems Per HPI  Physical Exam Triage Vital Signs ED Triage Vitals [12/12/21 1716]  Enc Vitals Group     BP 100/71     Pulse Rate 82     Resp 16     Temp 98 F (36.7 C)     Temp Source Oral     SpO2 100 %     Weight      Height      Head Circumference      Peak Flow      Pain Score  Pain Loc      Pain Edu?      Excl. in GC?    No data found.  Updated Vital Signs BP 100/71 (BP Location: Left Arm)   Pulse 82   Temp 98 F (36.7 C) (Oral)   Resp 16   LMP 11/27/2021 (Exact Date)   SpO2 100%   Visual Acuity Right Eye Distance:   Left Eye Distance:   Bilateral Distance:    Right Eye Near:   Left Eye Near:    Bilateral Near:     Physical Exam Constitutional:      General: She is not in acute distress.    Appearance: Normal appearance. She is not toxic-appearing or diaphoretic.  HENT:     Head: Normocephalic and atraumatic.  Eyes:     Extraocular Movements: Extraocular movements intact.     Conjunctiva/sclera: Conjunctivae normal.  Cardiovascular:     Rate and Rhythm: Normal rate and regular rhythm.     Pulses: Normal pulses.     Heart sounds: Normal heart sounds.  Pulmonary:     Effort: Pulmonary effort is normal. No respiratory distress.     Breath sounds: Normal breath sounds.  Abdominal:     General: Bowel sounds are normal. There is no distension.     Palpations: Abdomen is  soft.     Tenderness: There is no abdominal tenderness.  Genitourinary:    Comments: Deferred with shared decision-making.  Self swab performed. Neurological:     General: No focal deficit present.     Mental Status: She is alert and oriented to person, place, and time. Mental status is at baseline.  Psychiatric:        Mood and Affect: Mood normal.        Behavior: Behavior normal.        Thought Content: Thought content normal.        Judgment: Judgment normal.      UC Treatments / Results  Labs (all labs ordered are listed, but only abnormal results are displayed) Labs Reviewed  CERVICOVAGINAL ANCILLARY ONLY    EKG   Radiology No results found.  Procedures Procedures (including critical care time)  Medications Ordered in UC Medications - No data to display  Initial Impression / Assessment and Plan / UC Course  I have reviewed the triage vital signs and the nursing notes.  Pertinent labs & imaging results that were available during my care of the patient were reviewed by me and considered in my medical decision making (see chart for details).     Highly suspicious of bacterial vaginosis given patient's history.  Will treat with clindamycin given that she was recently treated with metronidazole and patient refusing topical medications.  Do not think pelvic exam is necessary given no other associated symptoms or concern for PID.  Patient to follow-up with provided contact information with gynecology given recurrent bacterial vaginosis.  Cervicovaginal swab pending. Will await results for any further treatment. Do not think that UA or urine preg is necessary. Discussed return precautions.  Patient verbalized understanding and was agreeable with plan. Final Clinical Impressions(s) / UC Diagnoses   Final diagnoses:  Vaginal discharge  Screening examination for venereal disease     Discharge Instructions      Vaginal swab is pending.  I am highly suspicious of  bacterial vaginosis given that you have recurrent bacterial vaginosis.  You are being treated with clindamycin antibiotic and please be sure that you take this medication with food.  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   ED Prescriptions     Medication Sig Dispense Auth. Provider   clindamycin (CLEOCIN) 150 MG capsule Take 2 capsules (300 mg total) by mouth in the morning and at bedtime for 7 days. 28 capsule [place], Acie Fredrickson, [place]      PDMP not reviewed this encounter.   Gustavus Bryant, [place] 12/12/21 1735

## 2021-12-12 NOTE — ED Triage Notes (Signed)
Pt presents to the office for vaginal discharge. She thinks she has BV and would like to be treated.

## 2021-12-13 LAB — CERVICOVAGINAL ANCILLARY ONLY
Bacterial Vaginitis (gardnerella): POSITIVE — AB
Candida Glabrata: NEGATIVE
Candida Vaginitis: NEGATIVE
Chlamydia: NEGATIVE
Comment: NEGATIVE
Comment: NEGATIVE
Comment: NEGATIVE
Comment: NEGATIVE
Comment: NEGATIVE
Comment: NORMAL
Neisseria Gonorrhea: NEGATIVE
Trichomonas: NEGATIVE

## 2022-01-19 ENCOUNTER — Inpatient Hospital Stay (HOSPITAL_COMMUNITY)
Admission: RE | Admit: 2022-01-19 | Discharge: 2022-01-19 | Disposition: A | Discharge status: Home / Self Care | Payer: Medicaid Other | Source: Ambulatory Visit | Attending: Pediatrics | Admitting: Pediatrics

## 2022-01-20 ENCOUNTER — Encounter (HOSPITAL_COMMUNITY): Payer: Self-pay | Admitting: *Deleted

## 2022-01-20 ENCOUNTER — Ambulatory Visit (HOSPITAL_COMMUNITY)
Admission: EM | Admit: 2022-01-20 | Discharge: 2022-01-20 | Disposition: A | Discharge status: Home / Self Care | Payer: Medicaid Other | Attending: Internal Medicine | Admitting: Internal Medicine

## 2022-01-20 DIAGNOSIS — N760 Acute vaginitis: Secondary | ICD-10-CM | POA: Diagnosis present

## 2022-01-20 MED ORDER — FLUCONAZOLE 150 MG PO TABS: 150.0000 mg | ORAL_TABLET | Freq: Once | ORAL | 0 refills | Status: AC

## 2022-01-20 NOTE — Discharge Instructions (Addendum)
Please take medications as prescribed We will call you with recommendations if labs are abnormal Please take first dose of fluconazole today.  You may take the second dose of fluconazole if your symptoms does not improve in 3 days. Return to urgent care if symptoms worsen.

## 2022-01-20 NOTE — ED Triage Notes (Signed)
Patient states that she is having some vaginal odor and itching x 3-4 days. She has not used anything OTC. She thinks she was BV and a yeast infection.

## 2022-01-21 ENCOUNTER — Telehealth (HOSPITAL_COMMUNITY): Payer: Self-pay | Admitting: Emergency Medicine

## 2022-01-21 LAB — CERVICOVAGINAL ANCILLARY ONLY
Bacterial Vaginitis (gardnerella): POSITIVE — AB
Candida Glabrata: NEGATIVE
Candida Vaginitis: POSITIVE — AB
Comment: NEGATIVE
Comment: NEGATIVE
Comment: NEGATIVE

## 2022-01-21 MED ORDER — METRONIDAZOLE 500 MG PO TABS: 500.0000 mg | ORAL_TABLET | Freq: Two times a day (BID) | ORAL | 0 refills | Status: DC

## 2022-01-21 MED ORDER — METRONIDAZOLE 0.75 % VA GEL: 1.0000 | Freq: Every day | VAGINAL | 0 refills | Status: DC

## 2022-01-23 NOTE — ED Provider Notes (Signed)
[facility]    CSN: [id number] Arrival date & time: 01/20/22  0800      History   Chief Complaint Chief Complaint  Patient presents with   Vaginitis    HPI Angela Hayden is a 21 y.o. female comes to the urgent care with 4-day history of whitish vaginal discharge.  No vaginal discharge is the patient with vaginal odor and associated with itching.  Patient denies any dysuria, frequency or urgency.  No abdominal pain.Marland Kitchen   HPI  Past Medical History:  Diagnosis Date   Chlamydia trachomatis infection in pregnancy 07/20/2018   Iron deficiency anemia during pregnancy 07/20/2018    Patient Active Problem List   Diagnosis Date Noted   Postpartum anemia 08/09/2018   Postoperative fever 08/08/2018   Postpartum infection 08/08/2018   Nexplanon in place 07/23/2018    Past Surgical History:  Procedure Laterality Date   CESAREAN SECTION N/A 07/21/2018   Procedure: CESAREAN SECTION;  Surgeon: Adam Phenix, MD;  Location: [facility] BIRTHING SUITES;  Service: Obstetrics;  Laterality: N/A;    OB History     Gravida  1   Para  1   Term  1   Preterm  0   AB  0   Living  1      SAB  0   IAB  0   Ectopic  0   Multiple  0   Live Births  1            Home Medications    Prior to Admission medications   Medication Sig Start Date End Date Taking? Authorizing Provider  metroNIDAZOLE (FLAGYL) 500 MG tablet Take 1 tablet (500 mg total) by mouth 2 (two) times daily. 01/21/22   Merrilee Jansky, MD  amLODipine (NORVASC) 2.5 MG tablet Take 1 tablet (2.5 mg total) by mouth daily. Patient not taking: Reported on 10/04/2018 08/15/18 09/28/20  Adam Phenix, MD  ferrous sulfate 325 (65 FE) MG tablet Take 1 tablet (325 mg total) by mouth daily with breakfast. 09/25/18 09/28/20  Adam Phenix, MD    Family History Family History  Problem Relation Age of Onset   Healthy Mother    Healthy Father     Social History Social History   Tobacco Use   Smoking status:  Never   Smokeless tobacco: Never  Vaping Use   Vaping Use: Never used  Substance Use Topics   Alcohol use: Yes    Comment: occasionally   Drug use: Yes    Types: Marijuana     Allergies   Patient has no known allergies.   Review of Systems Review of Systems  Gastrointestinal: Negative.   Genitourinary:  Positive for vaginal discharge. Negative for dysuria, frequency and urgency.     Physical Exam Triage Vital Signs ED Triage Vitals  Enc Vitals Group     BP 01/20/22 0814 104/71     Pulse Rate 01/20/22 0814 80     Resp 01/20/22 0814 18     Temp 01/20/22 0814 98.4 F (36.9 C)     Temp Source 01/20/22 0814 Oral     SpO2 01/20/22 0814 99 %     Weight --      Height --      Head Circumference --      Peak Flow --      Pain Score 01/20/22 0813 0     Pain Loc --      Pain Edu? --  Excl. in GC? --    No data found.  Updated Vital Signs BP 104/71 (BP Location: Left Arm)   Pulse 80   Temp 98.4 F (36.9 C) (Oral)   Resp 18   LMP 12/16/2021 (Exact Date)   SpO2 99%   Visual Acuity Right Eye Distance:   Left Eye Distance:   Bilateral Distance:    Right Eye Near:   Left Eye Near:    Bilateral Near:     Physical Exam Vitals and nursing note reviewed.  Constitutional:      General: She is not in acute distress.    Appearance: She is not ill-appearing.  Cardiovascular:     Rate and Rhythm: Normal rate and regular rhythm.  Pulmonary:     Effort: Pulmonary effort is normal.     Breath sounds: Normal breath sounds.  Abdominal:     General: Bowel sounds are normal.     Palpations: Abdomen is soft.  Neurological:     Mental Status: She is alert.      UC Treatments / Results  Labs (all labs ordered are listed, but only abnormal results are displayed) Labs Reviewed  CERVICOVAGINAL ANCILLARY ONLY - Abnormal; Notable for the following components:      Result Value   Bacterial Vaginitis (gardnerella) Positive (*)    Candida Vaginitis Positive (*)     All other components within normal limits    EKG   Radiology No results found.  Procedures Procedures (including critical care time)  Medications Ordered in UC Medications - No data to display  Initial Impression / Assessment and Plan / UC Course  I have reviewed the triage vital signs and the nursing notes.  Pertinent labs & imaging results that were available during my care of the patient were reviewed by me and considered in my medical decision making (see chart for details).     1.  Acute vaginitis: Cervicovaginal swab for BV/vaginal yeast Fluconazole for vaginal yeast infection Patient will be called if labs are abnormal Return precautions given. Final Clinical Impressions(s) / UC Diagnoses   Final diagnoses:  Acute vaginitis     Discharge Instructions      Please take medications as prescribed We will call you with recommendations if labs are abnormal Please take first dose of fluconazole today.  You may take the second dose of fluconazole if your symptoms does not improve in 3 days. Return to urgent care if symptoms worsen.    ED Prescriptions     Medication Sig Dispense Auth. Provider   fluconazole (DIFLUCAN) 150 MG tablet Take 1 tablet (150 mg total) by mouth once for 1 dose. 2 tablet Shayana Hornstein, Britta Mccreedy, MD      PDMP not reviewed this encounter.   Merrilee Jansky, MD 01/23/22 1051

## 2022-02-11 ENCOUNTER — Ambulatory Visit (HOSPITAL_COMMUNITY)
Admission: RE | Admit: 2022-02-11 | Discharge: 2022-02-11 | Disposition: A | Discharge status: Home / Self Care | Payer: Medicaid Other | Source: Ambulatory Visit | Attending: Family Medicine | Admitting: Family Medicine

## 2022-02-11 ENCOUNTER — Encounter (HOSPITAL_COMMUNITY): Payer: Self-pay

## 2022-02-11 VITALS — BP 105/71 | HR 79 | Temp 98.0°F | Resp 16

## 2022-02-11 DIAGNOSIS — R102 Pelvic and perineal pain: Secondary | ICD-10-CM

## 2022-02-11 LAB — POCT URINALYSIS DIPSTICK, ED / UC
Bilirubin Urine: NEGATIVE
Glucose, UA: NEGATIVE mg/dL
Hgb urine dipstick: NEGATIVE
Ketones, ur: NEGATIVE mg/dL
Leukocytes,Ua: NEGATIVE
Nitrite: NEGATIVE
Protein, ur: NEGATIVE mg/dL
Specific Gravity, Urine: 1.025 (ref 1.005–1.030)
Urobilinogen, UA: 0.2 mg/dL (ref 0.0–1.0)
pH: 7 (ref 5.0–8.0)

## 2022-02-11 LAB — POC URINE PREG, ED: Preg Test, Ur: NEGATIVE

## 2022-02-11 MED ORDER — KETOROLAC TROMETHAMINE 30 MG/ML IJ SOLN: 30.0000 mg | Freq: Once | INTRAMUSCULAR | Status: DC

## 2022-02-11 MED ORDER — IBUPROFEN 800 MG PO TABS: 800.0000 mg | ORAL_TABLET | Freq: Three times a day (TID) | ORAL | 0 refills | Status: DC | PRN

## 2022-02-11 MED ORDER — KETOROLAC TROMETHAMINE 30 MG/ML IJ SOLN
INTRAMUSCULAR | Status: AC
  Filled 2022-02-11: qty 1

## 2022-02-11 NOTE — Discharge Instructions (Addendum)
Staff will call you if anything is positive on the swab  Pregnancy test was negative  Urinalysis was normal  You have been given a shot of Toradol 30 mg today.  Take ibuprofen 800 mg--1 tab every 8 hours as needed for pain.

## 2022-02-11 NOTE — ED Provider Notes (Addendum)
[facility]    CSN: [id number] Arrival date & time: 02/11/22  1742      History   Chief Complaint Chief Complaint  Patient presents with   Abdominal Pain   Vaginal Pain    HPI Angela Hayden is a 21 y.o. female.    Abdominal Pain Vaginal Pain Associated symptoms include abdominal pain.   Here with suprapubic pain that began today.  It began about 50 minutes after she had intercourse.  No fever and no bleeding today.  No discharge.  Last menstrual cycle was about August 30 and lasted through the third.  No fever and no vomiting  Past Medical History:  Diagnosis Date   Chlamydia trachomatis infection in pregnancy 07/20/2018   Iron deficiency anemia during pregnancy 07/20/2018    Patient Active Problem List   Diagnosis Date Noted   Postpartum anemia 08/09/2018   Postoperative fever 08/08/2018   Postpartum infection 08/08/2018   Nexplanon in place 07/23/2018    Past Surgical History:  Procedure Laterality Date   CESAREAN SECTION N/A 07/21/2018   Procedure: CESAREAN SECTION;  Surgeon: Adam Phenix, MD;  Location: [facility] BIRTHING SU[facility];  Service: Obstetrics;  Laterality: N/A;    OB History     Gravida  1   Para  1   Term  1   Preterm  0   AB  0   Living  1      SAB  0   IAB  0   Ectopic  0   Multiple  0   Live Births  1            Home Medications    Prior to Admission medications   Medication Sig Start Date End Date Taking? Authorizing Provider  ibuprofen (ADVIL) 800 MG tablet Take 1 tablet (800 mg total) by mouth every 8 (eight) hours as needed (pain). 02/11/22  Yes Zenia Resides, MD  amLODipine (NORVASC) 2.5 MG tablet Take 1 tablet (2.5 mg total) by mouth daily. Patient not taking: Reported on 10/04/2018 08/15/18 09/28/20  Adam Phenix, MD  ferrous sulfate 325 (65 FE) MG tablet Take 1 tablet (325 mg total) by mouth daily with breakfast. 09/25/18 09/28/20  Adam Phenix, MD    Family History Family History  Problem  Relation Age of Onset   Healthy Mother    Healthy Father     Social History Social History   Tobacco Use   Smoking status: Never   Smokeless tobacco: Never  Vaping Use   Vaping Use: Never used  Substance Use Topics   Alcohol use: Yes    Comment: occasionally   Drug use: Yes    Types: Marijuana     Allergies   Patient has no known allergies.   Review of Systems Review of Systems  Gastrointestinal:  Positive for abdominal pain.  Genitourinary:  Positive for vaginal pain.     Physical Exam Triage Vital Signs ED Triage Vitals  Enc Vitals Group     BP 02/11/22 1809 105/71     Pulse Rate 02/11/22 1809 79     Resp 02/11/22 1809 16     Temp 02/11/22 1809 98 F (36.7 C)     Temp Source 02/11/22 1809 Oral     SpO2 02/11/22 1809 98 %     Weight --      Height --      Head Circumference --      Peak Flow --      Pain  Score 02/11/22 1811 8     Pain Loc --      Pain Edu? --      Excl. in GC? --    No data found.  Updated Vital Signs BP 105/71 (BP Location: Right Arm)   Pulse 79   Temp 98 F (36.7 C) (Oral)   Resp 16   LMP 01/26/2022 (Approximate)   SpO2 98%   Visual Acuity Right Eye Distance:   Left Eye Distance:   Bilateral Distance:    Right Eye Near:   Left Eye Near:    Bilateral Near:     Physical Exam Vitals reviewed.  Constitutional:      General: She is not in acute distress.    Appearance: She is not ill-appearing, toxic-appearing or diaphoretic.  HENT:     Mouth/Throat:     Mouth: Mucous membranes are moist.  Cardiovascular:     Rate and Rhythm: Normal rate and regular rhythm.     Heart sounds: No murmur heard. Pulmonary:     Effort: Pulmonary effort is normal.     Breath sounds: Normal breath sounds.  Abdominal:     Palpations: Abdomen is soft. There is no mass.     Tenderness: There is abdominal tenderness (suprapubic).  Genitourinary:    Comments: Normal external female.  No rash or ulcerations.  Vagina is pink and rugated.   Cervix is normal in appearance there is no ulceration or abrasion or mass on examination of the vaginal walls and the cervix.  On bimanual exam there is no cervical motion tenderness and there is no uterine enlargement or adnexal mass Skin:    Coloration: Skin is not jaundiced or pale.  Neurological:     General: No focal deficit present.     Mental Status: She is alert and oriented to person, place, and time.  Psychiatric:        Behavior: Behavior normal.      UC Treatments / Results  Labs (all labs ordered are listed, but only abnormal results are displayed) Labs Reviewed  POCT URINALYSIS DIPSTICK, ED / UC  POC URINE PREG, ED  CERVICOVAGINAL ANCILLARY ONLY    EKG   Radiology No results found.  Procedures Procedures (including critical care time)  Medications Ordered in UC Medications - No data to display   Initial Impression / Assessment and Plan / UC Course  I have reviewed the triage vital signs and the nursing notes.  Pertinent labs & imaging results that were available during my care of the patient were reviewed by me and considered in my medical decision making (see chart for details).        UPT is negative and UA is negative.  Swab was sent, and staff will notify her if anything is positive.  We will provide a prescription for ibuprofen and an injection of Toradol tonight.   The patient had said she would like the Toradol injection earlier, later she declined the Toradol injection.  Order discontinued.  She declined HIV and syphilis screening, she is given printed education on safe sex practices Final Clinical Impressions(s) / UC Diagnoses   Final diagnoses:  Pelvic pain in female     Discharge Instructions      Staff will call you if anything is positive on the swab  Pregnancy test was negative  Urinalysis was normal  You have been given a shot of Toradol 30 mg today.  Take ibuprofen 800 mg--1 tab every 8 hours as needed for  pain.       ED Prescriptions     Medication Sig Dispense Auth. Provider   ibuprofen (ADVIL) 800 MG tablet Take 1 tablet (800 mg total) by mouth every 8 (eight) hours as needed (pain). 21 tablet [place] Osmer, Janace Aris, MD      I have reviewed the PDMP during this encounter.   Zenia Resides, MD 02/11/22 Ebony Cargo    Zenia Resides, MD 02/11/22 [id number]

## 2022-02-11 NOTE — ED Triage Notes (Signed)
Onset lower abdominal and pelvic pain today. 15 minutes after sex Patient having pain along her c-section scar. Patient having sharp internal vaginal pain. No bleeding.

## 2022-02-14 LAB — CERVICOVAGINAL ANCILLARY ONLY
Bacterial Vaginitis (gardnerella): NEGATIVE
Candida Glabrata: NEGATIVE
Candida Vaginitis: NEGATIVE
Chlamydia: NEGATIVE
Comment: NEGATIVE
Comment: NEGATIVE
Comment: NEGATIVE
Comment: NEGATIVE
Comment: NEGATIVE
Comment: NORMAL
Neisseria Gonorrhea: NEGATIVE
Trichomonas: NEGATIVE

## 2022-03-20 ENCOUNTER — Encounter (HOSPITAL_COMMUNITY): Payer: Self-pay

## 2022-03-20 ENCOUNTER — Ambulatory Visit (HOSPITAL_COMMUNITY)
Admission: EM | Admit: 2022-03-20 | Discharge: 2022-03-20 | Disposition: A | Discharge status: Home / Self Care | Payer: Medicaid Other | Attending: Internal Medicine | Admitting: Internal Medicine

## 2022-03-20 DIAGNOSIS — J039 Acute tonsillitis, unspecified: Secondary | ICD-10-CM | POA: Insufficient documentation

## 2022-03-20 LAB — POCT RAPID STREP A, ED / UC: Streptococcus, Group A Screen (Direct): NEGATIVE

## 2022-03-20 MED ORDER — AMOXICILLIN-POT CLAVULANATE 875-125 MG PO TABS: 1.0000 | ORAL_TABLET | Freq: Two times a day (BID) | ORAL | 0 refills | Status: DC

## 2022-03-20 NOTE — Discharge Instructions (Addendum)
Augmentin was sent to the pharmacy, take this medication 2 times daily for the next 7 days.  Please take this medication with food on your stomach to prevent potential stomach upset.  Strep culture was sent, the results warrant a change in your plan of care we will give you a call.   You may follow-up at this clinic if symptoms are not improving.

## 2022-03-20 NOTE — ED Provider Notes (Addendum)
[facility]    CSN: [id number] Arrival date & time: 03/20/22  1654      History   Chief Complaint Chief Complaint  Patient presents with   Sore Throat    HPI Angela Hayden is a 21 y.o. female.  Patient complaining of sore throat x 2 days.  She endorses painful swallowing.  Patient reports that symptoms are simply worsening.  Patient denies any known exposure to strep.  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  Patient denies any other cold symptoms.  Patient has not taken any medications for symptoms.    Sore Throat Pertinent negatives include no chest pain.    Past Medical History:  Diagnosis Date   Chlamydia trachomatis infection in pregnancy 07/20/2018   Iron deficiency anemia during pregnancy 07/20/2018    Patient Active Problem List   Diagnosis Date Noted   Postpartum anemia 08/09/2018   Postoperative fever 08/08/2018   Postpartum infection 08/08/2018   Nexplanon in place 07/23/2018    Past Surgical History:  Procedure Laterality Date   CESAREAN SECTION N/A 07/21/2018   Procedure: CESAREAN SECTION;  Surgeon: Adam Phenix, MD;  Location: [facility]THING SUIT[facility];  Service: Obstetrics;  Laterality: N/A;    OB History     Gravida  1   Para  1   Term  1   Preterm  0   AB  0   Living  1      SAB  0   IAB  0   Ectopic  0   Multiple  0   Live Births  1            Home Medications    Prior to Admission medications   Medication Sig Start Date End Date Taking? Authorizing Provider  amoxicillin-clavulanate (AUGMENTIN) 875-125 MG tablet Take 1 tablet by mouth every 12 (twelve) hours. 03/20/22  Yes Debby Freiberg, NP  ibuprofen (ADVIL) 800 MG tablet Take 1 tablet (800 mg total) by mouth every 8 (eight) hours as needed (pain). 02/11/22   Zenia Resides, MD  amLODipine (NORVASC) 2.5 MG tablet Take 1 tablet (2.5 mg total) by mouth daily. Patient not taking: Reported on 10/04/2018 08/15/18 09/28/20  Adam Phenix, MD  ferrous  sulfate 325 (65 FE) MG tablet Take 1 tablet (325 mg total) by mouth daily with breakfast. 09/25/18 09/28/20  Adam Phenix, MD    Family History Family History  Problem Relation Age of Onset   Healthy Mother    Healthy Father     Social History Social History   Tobacco Use   Smoking status: Never   Smokeless tobacco: Never  Vaping Use   Vaping Use: Never used  Substance Use Topics   Alcohol use: Yes    Comment: occasionally   Drug use: Yes    Types: Marijuana     Allergies   Patient has no known allergies.   Review of Systems Review of Systems  Constitutional:  Negative for activity change, appetite change, chills, fatigue and fever.  HENT:  Positive for sore throat and trouble swallowing. Negative for congestion, ear pain, hearing loss, postnasal drip, rhinorrhea, sinus pressure and sinus pain.   Respiratory:  Negative for cough.   Cardiovascular:  Negative for chest pain.  Gastrointestinal:  Negative for nausea and vomiting.     Physical Exam Triage Vital Signs ED Triage Vitals [03/20/22 1722]  Enc Vitals Group     BP      Pulse  Resp      Temp      Temp src      SpO2      Weight      Height      Head Circumference      Peak Flow      Pain Score 8     Pain Loc      Pain Edu?      Excl. in GC?    No data found.  Updated Vital Signs BP 103/68 (BP Location: Right Arm)   Pulse 75   Temp 98 F (36.7 C) (Oral)   Resp 12   LMP 03/20/2022   SpO2 99%      Physical Exam Vitals and nursing note reviewed.  HENT:     Nose: No congestion or rhinorrhea.     Right Turbinates: Not enlarged, swollen or pale.     Left Turbinates: Not enlarged, swollen or pale.     Right Sinus: No maxillary sinus tenderness or frontal sinus tenderness.     Left Sinus: No maxillary sinus tenderness or frontal sinus tenderness.     Mouth/Throat:     Pharynx: Pharyngeal swelling and posterior oropharyngeal erythema present. No uvula swelling.     Tonsils: No tonsillar  exudate. 2+ on the right. 2+ on the left.  Cardiovascular:     Rate and Rhythm: Normal rate and regular rhythm.     Heart sounds: Normal heart sounds, S1 normal and S2 normal.  Pulmonary:     Effort: Pulmonary effort is normal.     Breath sounds: Normal breath sounds and air entry. No decreased breath sounds, wheezing, rhonchi or rales.  Lymphadenopathy:     Cervical: Cervical adenopathy present.     Right cervical: Superficial cervical adenopathy present.     Left cervical: Superficial cervical adenopathy present.      UC Treatments / Results  Labs (all labs ordered are listed, but only abnormal results are displayed) Labs Reviewed  CULTURE, GROUP A STREP [facility])  POCT RAPID STREP A, ED / UC    EKG   Radiology No results found.  Procedures Procedures (including critical care time)  Medications Ordered in UC Medications - No data to display  Initial Impression / Assessment and Plan / UC Course  I have reviewed the triage vital signs and the nursing notes.  Pertinent labs & imaging results that were available during my care of the patient were reviewed by me and considered in my medical decision making (see chart for details).     Patient was evaluated for tonsillitis.  Rapid test negative in office.  Throat culture is pending.  Due to symptomology patient was placed on Augmentin.  Patient made aware of antibiotic regiment and possible side effects . Patient made aware of results reporting protocol.  Patient verbalized understanding of instructions. Final Clinical Impressions(s) / UC Diagnoses   Final diagnoses:  Tonsillitis     Discharge Instructions      Augmentin was sent to the pharmacy, take this medication 2 times daily for the next 7 days.  Please take this medication with food on your stomach to prevent potential stomach upset.  Strep culture was sent, the results warrant a change in your plan of care we will give you a call.   You may follow-up at this  clinic if symptoms are not improving.      ED Prescriptions     Medication Sig Dispense Auth. Provider   amoxicillin-clavulanate (AUGMENTIN) 875-125 MG tablet Take  1 tablet by mouth every 12 (twelve) hours. 14 tablet Flossie Dibble, NP      PDMP not reviewed this encounter.   Flossie Dibble, NP 03/20/22 1828    Flossie Dibble, NP 03/20/22 1831

## 2022-03-20 NOTE — ED Triage Notes (Signed)
Pt complains of a sore throat x2days with difficulty swallowing

## 2022-03-22 LAB — CULTURE, GROUP A STREP (THRC)

## 2022-04-01 ENCOUNTER — Ambulatory Visit (HOSPITAL_COMMUNITY): Payer: Medicaid Other

## 2022-06-12 ENCOUNTER — Encounter (HOSPITAL_COMMUNITY): Payer: Self-pay

## 2022-06-12 ENCOUNTER — Ambulatory Visit (HOSPITAL_COMMUNITY)
Admission: RE | Admit: 2022-06-12 | Discharge: 2022-06-12 | Disposition: A | Discharge status: Home / Self Care | Payer: Medicaid Other | Source: Ambulatory Visit | Attending: Emergency Medicine | Admitting: Emergency Medicine

## 2022-06-12 VITALS — BP 121/83 | HR 79 | Temp 98.6°F | Resp 16

## 2022-06-12 DIAGNOSIS — N898 Other specified noninflammatory disorders of vagina: Secondary | ICD-10-CM | POA: Insufficient documentation

## 2022-06-12 MED ORDER — METRONIDAZOLE 500 MG PO TABS: 500.0000 mg | ORAL_TABLET | Freq: Two times a day (BID) | ORAL | 0 refills | Status: DC

## 2022-06-12 NOTE — Discharge Instructions (Signed)
You are being treated for bacterial vaginosis today.  An antibiotic called Flagyl was sent to the pharmacy, you will take this medication twice a day for the next 7 days.  Please refrain from any alcohol use while taking this medication and 72 hours after finishing the antibiotic.  Please finish all of the antibiotic even if your symptoms improve, this is important to clear your infection and to prevent bacterial resistance.    Your swab has been sent for verification, we we will call you if any of your test results warrant a change in your plan of care.  Please make sure that you are able to access MyChart to review all your results.  I have attached information about bacterial vaginosis to the back of your paperwork.

## 2022-06-12 NOTE — ED Provider Notes (Signed)
[place]    CSN: [id number] Arrival date & time: 06/12/22  1210      History   Chief Complaint Chief Complaint  Patient presents with   Exposure to STD    HPI Angela Hayden is a 22 y.o. female.  Patient presents complaining of vaginal discharge that started 2 days ago.  Patient reports that discharge has a "fishy" odor.  Patient denies any dysuria, vaginal irritation, vaginal itching, abdominal pain, pelvic pain, dyspareunia, nausea, vomiting, fever, rash, lesion, chills, or any systemic symptoms.  Patient denies any change with sexual partners.  She reports having 1 current sexual partner.  She reports that her symptoms resemble a previous episode of BV.  Patient has not taken any medications for her symptoms.  Last menstrual period was 06/10/2022.    Exposure to STD    Past Medical History:  Diagnosis Date   Chlamydia trachomatis infection in pregnancy 07/20/2018   Iron deficiency anemia during pregnancy 07/20/2018    Patient Active Problem List   Diagnosis Date Noted   Postpartum anemia 08/09/2018   Postoperative fever 08/08/2018   Postpartum infection 08/08/2018   Nexplanon in place 07/23/2018    Past Surgical History:  Procedure Laterality Date   CESAREAN SECTION N/A 07/21/2018   Procedure: CESAREAN SECTION;  Surgeon: Woodroe Mode, MD;  Location: [place];  Service: Obstetrics;  Laterality: N/A;    OB History     Gravida  1   Para  1   Term  1   Preterm  0   AB  0   Living  1      SAB  0   IAB  0   Ectopic  0   Multiple  0   Live Births  1            Home Medications    Prior to Admission medications   Medication Sig Start Date End Date Taking? Authorizing Provider  metroNIDAZOLE (FLAGYL) 500 MG tablet Take 1 tablet (500 mg total) by mouth 2 (two) times daily. 06/12/22  Yes Flossie Dibble, NP  amLODipine (NORVASC) 2.5 MG tablet Take 1 tablet (2.5 mg total) by mouth daily. Patient not taking:  Reported on 10/04/2018 08/15/18 09/28/20  Woodroe Mode, MD  ferrous sulfate 325 (65 FE) MG tablet Take 1 tablet (325 mg total) by mouth daily with breakfast. 09/25/18 09/28/20  Woodroe Mode, MD    Family History Family History  Problem Relation Age of Onset   Healthy Mother    Healthy Father     Social History Social History   Tobacco Use   Smoking status: Never   Smokeless tobacco: Never  Vaping Use   Vaping Use: Never used  Substance Use Topics   Alcohol use: Yes    Comment: occasionally   Drug use: Yes    Types: Marijuana     Allergies   Patient has no known allergies.   Review of Systems Review of Systems Per HPI  Physical Exam Triage Vital Signs ED Triage Vitals [06/12/22 1239]  Enc Vitals Group     BP 121/83     Pulse Rate 79     Resp 16     Temp 98.6 F (37 C)     Temp Source Oral     SpO2 98 %     Weight      Height      Head Circumference      Peak Flow  Pain Score      Pain Loc      Pain Edu?      Excl. in [place]?    No data found.  Updated Vital Signs BP 121/83 (BP Location: Left Arm)   Pulse 79   Temp 98.6 F (37 C) (Oral)   Resp 16   LMP 06/10/2022   SpO2 98%      Physical Exam Vitals and nursing note reviewed.  Constitutional:      Appearance: Normal appearance.  Genitourinary:    Comments: Deferred exam Neurological:     Mental Status: She is alert.  Psychiatric:        Behavior: Behavior is cooperative.      UC Treatments / Results  Labs (all labs ordered are listed, but only abnormal results are displayed) Labs Reviewed  CERVICOVAGINAL ANCILLARY ONLY    EKG   Radiology No results found.  Procedures Procedures (including critical care time)  Medications Ordered in UC Medications - No data to display  Initial Impression / Assessment and Plan / UC Course  I have reviewed the triage vital signs and the nursing notes.  Pertinent labs & imaging results that were available during my care of the patient were  reviewed by me and considered in my medical decision making (see chart for details).     Patient was evaluated for vaginal discharge.  Based on symptomology, patient is being treated for possible BV.  Vaginal swab is pending for confirmation.  Flagyl antibiotic was sent to the pharmacy.  Patient was made aware of treatment regiment.  Patient verbalized understanding of instructions.  Charting was provided using a a verbal dictation system, charting was proofread for errors, errors may occur which could change the meaning of the information charted.   Final Clinical Impressions(s) / UC Diagnoses   Final diagnoses:  Vaginal discharge     Discharge Instructions      You are being treated for bacterial vaginosis today.  An antibiotic called Flagyl was sent to the pharmacy, you will take this medication twice a day for the next 7 days.  Please refrain from any alcohol use while taking this medication and 72 hours after finishing the antibiotic.  Please finish all of the antibiotic even if your symptoms improve, this is important to clear your infection and to prevent bacterial resistance.    Your swab has been sent for verification, we we will call you if any of your test results warrant a change in your plan of care.  Please make sure that you are able to access MyChart to review all your results.  I have attached information about bacterial vaginosis to the back of your paperwork.      ED Prescriptions     Medication Sig Dispense Auth. Provider   metroNIDAZOLE (FLAGYL) 500 MG tablet Take 1 tablet (500 mg total) by mouth 2 (two) times daily. 14 tablet Flossie Dibble, NP      PDMP not reviewed this encounter.   Flossie Dibble, NP 06/12/22 1432

## 2022-06-12 NOTE — ED Triage Notes (Signed)
C/o of fishy vaginal odor for several days. Pt reports a h/o BV.

## 2022-06-13 LAB — CERVICOVAGINAL ANCILLARY ONLY
Bacterial Vaginitis (gardnerella): POSITIVE — AB
Candida Glabrata: NEGATIVE
Candida Vaginitis: NEGATIVE
Chlamydia: NEGATIVE
Comment: NEGATIVE
Comment: NEGATIVE
Comment: NEGATIVE
Comment: NEGATIVE
Comment: NEGATIVE
Comment: NORMAL
Neisseria Gonorrhea: NEGATIVE
Trichomonas: NEGATIVE

## 2022-08-15 ENCOUNTER — Ambulatory Visit (HOSPITAL_COMMUNITY): Payer: Medicaid Other

## 2022-08-19 ENCOUNTER — Ambulatory Visit (HOSPITAL_COMMUNITY): Payer: Medicaid Other

## 2022-08-19 ENCOUNTER — Encounter (HOSPITAL_COMMUNITY): Payer: Self-pay

## 2022-08-19 ENCOUNTER — Ambulatory Visit (HOSPITAL_COMMUNITY)
Admission: EM | Admit: 2022-08-19 | Discharge: 2022-08-19 | Disposition: A | Discharge status: Home / Self Care | Payer: Medicaid Other | Attending: Emergency Medicine | Admitting: Emergency Medicine

## 2022-08-19 DIAGNOSIS — N898 Other specified noninflammatory disorders of vagina: Secondary | ICD-10-CM | POA: Insufficient documentation

## 2022-08-19 MED ORDER — METRONIDAZOLE 500 MG PO TABS: 500.0000 mg | ORAL_TABLET | Freq: Two times a day (BID) | ORAL | 0 refills | Status: DC

## 2022-08-19 NOTE — ED Triage Notes (Signed)
Vaginal discharge for 3 days. No pain or urinary symptoms.

## 2022-08-19 NOTE — Discharge Instructions (Addendum)
Overall your symptoms are consistent with bacterial vaginosis.  Please take the Flagyl as prescribed until completed.  Please do not drink alcohol while on this medication or within 72 hours of finishing as it can make you violently sick.  Unfortunately sometimes intercourse can disrupt the vaginal pH and lead to BV.  You can help minimize this by using Dove sensitive skin soap, showering after intercourse, wearing cotton underwear, and taking a daily probiotic with lactobacillus.Do not douche, avoid bathing and do not put soap inside your vagina.   We will call you if we need to change or adjust your treatment plan.  Please return to clinic if you develop any new or concerning symptoms.

## 2022-08-19 NOTE — ED Provider Notes (Signed)
[place]    CSN: [id number] Arrival date & time: 08/19/22  1426      History   Chief Complaint Chief Complaint  Patient presents with   Vaginal Discharge    HPI Angela Hayden is a 22 y.o. female.   Patient presents to clinic for complaint of increased vaginal discharge with odor over the past 3 days.  Denies dysuria, itching, pain with intercourse, hematuria, fever, flank pain or other symptoms.  Denies concern for sexually transmitted infections, declined HIV and syphilis screening in clinic.  Reports her and her partner are trying to conceive and feels like she develops BV every time they have intercourse.   The history is provided by the patient.  Vaginal Discharge Associated symptoms: no abdominal pain, no dysuria and no fever     Past Medical History:  Diagnosis Date   Chlamydia trachomatis infection in pregnancy 07/20/2018   Iron deficiency anemia during pregnancy 07/20/2018    Patient Active Problem List   Diagnosis Date Noted   Postpartum anemia 08/09/2018   Postoperative fever 08/08/2018   Postpartum infection 08/08/2018   Nexplanon in place 07/23/2018    Past Surgical History:  Procedure Laterality Date   CESAREAN SECTION N/A 07/21/2018   Procedure: CESAREAN SECTION;  Surgeon: Woodroe Mode, MD;  Location: [place];  Service: Obstetrics;  Laterality: N/A;    OB History     Gravida  1   Para  1   Term  1   Preterm  0   AB  0   Living  1      SAB  0   IAB  0   Ectopic  0   Multiple  0   Live Births  1            Home Medications    Prior to Admission medications   Medication Sig Start Date End Date Taking? Authorizing Provider  metroNIDAZOLE (FLAGYL) 500 MG tablet Take 1 tablet (500 mg total) by mouth 2 (two) times daily. 08/19/22  Yes Louretta Shorten, [place] N, FNP  amLODipine (NORVASC) 2.5 MG tablet Take 1 tablet (2.5 mg total) by mouth daily. Patient not taking: Reported on 10/04/2018 08/15/18 09/28/20   Woodroe Mode, MD  ferrous sulfate 325 (65 FE) MG tablet Take 1 tablet (325 mg total) by mouth daily with breakfast. 09/25/18 09/28/20  Woodroe Mode, MD    Family History Family History  Problem Relation Age of Onset   Healthy Mother    Healthy Father     Social History Social History   Tobacco Use   Smoking status: Never   Smokeless tobacco: Never  Vaping Use   Vaping Use: Never used  Substance Use Topics   Alcohol use: Yes    Comment: occasionally   Drug use: Yes    Types: Marijuana     Allergies   Patient has no known allergies.   Review of Systems Review of Systems  Constitutional:  Negative for fever.  Gastrointestinal:  Negative for abdominal pain.  Genitourinary:  Positive for vaginal discharge. Negative for dysuria, flank pain, genital sores, hematuria, pelvic pain, urgency and vaginal pain.     Physical Exam Triage Vital Signs ED Triage Vitals  Enc Vitals Group     BP 08/19/22 1503 100/61     Pulse Rate 08/19/22 1503 (!) 103     Resp 08/19/22 1503 16     Temp 08/19/22 1503 98.4 F (36.9 C)     Temp Source  08/19/22 1503 Oral     SpO2 08/19/22 1503 98 %     Weight --      Height 08/19/22 1502 5\' 3"  (1.6 m)     Head Circumference --      Peak Flow --      Pain Score 08/19/22 1501 0     Pain Loc --      Pain Edu? --      Excl. in [place]? --    No data found.  Updated Vital Signs BP 100/61 (BP Location: Left Arm)   Pulse (!) 103   Temp 98.4 F (36.9 C) (Oral)   Resp 16   Ht 5\' 3"  (1.6 m)   LMP 08/10/2022 (Approximate)   SpO2 98%   BMI 19.49 kg/m   Visual Acuity Right Eye Distance:   Left Eye Distance:   Bilateral Distance:    Right Eye Near:   Left Eye Near:    Bilateral Near:     Physical Exam Vitals and nursing note reviewed.  Constitutional:      Appearance: Normal appearance.  HENT:     Head: Normocephalic and atraumatic.     Right Ear: External ear normal.     Left Ear: External ear normal.     Mouth/Throat:     Mouth:  Mucous membranes are moist.  Cardiovascular:     Rate and Rhythm: Normal rate and regular rhythm.  Pulmonary:     Effort: Pulmonary effort is normal. No respiratory distress.  Musculoskeletal:        General: No swelling. Normal range of motion.  Skin:    General: Skin is warm and dry.     Capillary Refill: Capillary refill takes less than 2 seconds.  Neurological:     Mental Status: She is alert and oriented to person, place, and time.  Psychiatric:        Mood and Affect: Mood normal.      UC Treatments / Results  Labs (all labs ordered are listed, but only abnormal results are displayed) Labs Reviewed  CERVICOVAGINAL ANCILLARY ONLY    EKG   Radiology No results found.  Procedures Procedures (including critical care time)  Medications Ordered in UC Medications - No data to display  Initial Impression / Assessment and Plan / UC Course  I have reviewed the triage vital signs and the nursing notes.  Pertinent labs & imaging results that were available during my care of the patient were reviewed by me and considered in my medical decision making (see chart for details).  Vitals and triage reviewed, patient is hemodynamically stable.  Presents to clinic over concerns for BV, has recurrent BV infections especially after intercourse with her significant other.  Will treat prophylactically with Flagyl, cervical vaginal swab obtained and will call if treatment needs to be adjusted or change.  Methods to help minimize potential BV infections in the future discussed, patient verbalized understanding.  Return and follow-up care discussed.     Final Clinical Impressions(s) / UC Diagnoses   Final diagnoses:  Vaginal discharge     Discharge Instructions      Overall your symptoms are consistent with bacterial vaginosis.  Please take the Flagyl as prescribed until completed.  Please do not drink alcohol while on this medication or within 72 hours of finishing as it can make  you violently sick.  Unfortunately sometimes intercourse can disrupt the vaginal pH and lead to BV.  You can help minimize this by using Dove sensitive skin  soap, showering after intercourse, wearing cotton underwear, and taking a daily probiotic with lactobacillus.Do not douche, avoid bathing and do not put soap inside your vagina.   We will call you if we need to change or adjust your treatment plan.  Please return to clinic if you develop any new or concerning symptoms.      ED Prescriptions     Medication Sig Dispense Auth. Provider   metroNIDAZOLE (FLAGYL) 500 MG tablet Take 1 tablet (500 mg total) by mouth 2 (two) times daily. 14 tablet Brynda Heick, [place] N, [place]      PDMP not reviewed this encounter.   Favor Kreh, [place] N, [place] 08/19/22 [id number]

## 2022-08-22 LAB — CERVICOVAGINAL ANCILLARY ONLY
Bacterial Vaginitis (gardnerella): POSITIVE — AB
Candida Glabrata: NEGATIVE
Candida Vaginitis: NEGATIVE
Chlamydia: NEGATIVE
Comment: NEGATIVE
Comment: NEGATIVE
Comment: NEGATIVE
Comment: NEGATIVE
Comment: NEGATIVE
Comment: NORMAL
Neisseria Gonorrhea: NEGATIVE
Trichomonas: NEGATIVE

## 2022-09-06 ENCOUNTER — Ambulatory Visit (HOSPITAL_COMMUNITY)
Admission: EM | Admit: 2022-09-06 | Discharge: 2022-09-06 | Disposition: A | Discharge status: Home / Self Care | Payer: Medicaid Other | Attending: Emergency Medicine | Admitting: Emergency Medicine

## 2022-09-06 ENCOUNTER — Encounter (HOSPITAL_COMMUNITY): Payer: Self-pay

## 2022-09-06 DIAGNOSIS — N898 Other specified noninflammatory disorders of vagina: Secondary | ICD-10-CM

## 2022-09-06 MED ORDER — FLUCONAZOLE 150 MG PO TABS: ORAL_TABLET | ORAL | 0 refills | Status: DC

## 2022-09-06 MED ORDER — METRONIDAZOLE 500 MG PO TABS: 500.0000 mg | ORAL_TABLET | Freq: Two times a day (BID) | ORAL | 0 refills | Status: DC

## 2022-09-06 NOTE — ED Triage Notes (Signed)
Pt states vaginal discharge with itching for the past 4 days.

## 2022-09-06 NOTE — Discharge Instructions (Signed)
Today you are being treated prophylactically for  Bacterial vaginosis and yeast  Take Metronidazole 500 mg twice a day for 7 days, do not drink alcohol while using medication, this will make you feel sick   Take 1 Diflucan tablet on day 1 then take second tablet after completion of all antibiotics  Bacterial vaginosis which results from an overgrowth of one on several organisms that are normally present in your vagina. Vaginosis is an inflammation of the vagina that can result in discharge, itching and pain.  Labs pending 2-3 days, you will be contacted if positive for any sti and treatment will be sent to the pharmacy, you will have to return to the clinic if positive for gonorrhea to receive treatment   Please refrain from having sex until labs results, if positive please refrain from having sex until treatment complete and symptoms resolve   If positive for  Chlamydia  gonorrhea or trichomoniasis please notify partner or partners so they may tested as well  Moving forward, it is recommended you use some form of protection against the transmission of sti infections  such as condoms or dental dams with each sexual encounter     In addition: Avoid baths, hot tubs and whirlpool spas.  Don't use scented or harsh soaps Avoid irritants. These include scented tampons and pads. Wipe from front to back after using the toilet. Don't douche. Your vagina doesn't require cleansing other than normal bathing.  Use a condom.  Wear cotton underwear, this fabric absorbs some moisture.

## 2022-09-06 NOTE — ED Provider Notes (Addendum)
[facility]    CSN: [id number] Arrival date & time: 09/06/22  1426      History   Chief Complaint Chief Complaint  Patient presents with   Vaginal Discharge    HPI Angela Hayden is a 22 y.o. female.   For evaluation of vaginal discharge with itching and irritation present for 4 days.  Symptoms are reoccurring, typically positive for bacterial vaginosis and yeast.  Endorses symptoms typically occur after ejaculation into the vaginal canal of partner.  Denies urinary symptoms, lower abdominal pain or pressure, flank pain.  Has not attempted treatment at home  Past Medical History:  Diagnosis Date   Chlamydia trachomatis infection in pregnancy 07/20/2018   Iron deficiency anemia during pregnancy 07/20/2018    Patient Active Problem List   Diagnosis Date Noted   Postpartum anemia 08/09/2018   Postoperative fever 08/08/2018   Postpartum infection 08/08/2018   Nexplanon in place 07/23/2018    Past Surgical History:  Procedure Laterality Date   CESAREAN SECTION N/A 07/21/2018   Procedure: CESAREAN SECTION;  Surgeon: Adam Phenix, MD;  Location: [facility];  Service: Obstetrics;  Laterality: N/A;    OB History     Gravida  1   Para  1   Term  1   Preterm  0   AB  0   Living  1      SAB  0   IAB  0   Ectopic  0   Multiple  0   Live Births  1            Home Medications    Prior to Admission medications   Medication Sig Start Date End Date Taking? Authorizing Provider  metroNIDAZOLE (FLAGYL) 500 MG tablet Take 1 tablet (500 mg total) by mouth 2 (two) times daily. 08/19/22   [place], [place] N, FNP  amLODipine (NORVASC) 2.5 MG tablet Take 1 tablet (2.5 mg total) by mouth daily. Patient not taking: Reported on 10/04/2018 08/15/18 09/28/20  Adam Phenix, MD  ferrous sulfate 325 (65 FE) MG tablet Take 1 tablet (325 mg total) by mouth daily with breakfast. 09/25/18 09/28/20  Adam Phenix, MD    Family History Family History   Problem Relation Age of Onset   Healthy Mother    Healthy Father     Social History Social History   Tobacco Use   Smoking status: Never   Smokeless tobacco: Never  Vaping Use   Vaping Use: Never used  Substance Use Topics   Alcohol use: Yes    Comment: occasionally   Drug use: Yes    Types: Marijuana     Allergies   Patient has no known allergies.   Review of Systems Review of Systems  Genitourinary:  Positive for vaginal discharge.     Physical Exam Triage Vital Signs ED Triage Vitals  Enc Vitals Group     BP 09/06/22 1438 98/64     Pulse Rate 09/06/22 1438 85     Resp 09/06/22 1438 16     Temp 09/06/22 1438 98 F (36.7 C)     Temp Source 09/06/22 1438 Oral     SpO2 09/06/22 1438 98 %     Weight --      Height --      Head Circumference --      Peak Flow --      Pain Score 09/06/22 1439 0     Pain Loc --      Pain  Edu? --      Excl. in [place]? --    No data found.  Updated Vital Signs BP 98/64 (BP Location: Left Arm)   Pulse 85   Temp 98 F (36.7 C) (Oral)   Resp 16   LMP 08/29/2022 (Approximate)   SpO2 98%   Visual Acuity Right Eye Distance:   Left Eye Distance:   Bilateral Distance:    Right Eye Near:   Left Eye Near:    Bilateral Near:     Physical Exam Constitutional:      Appearance: Normal appearance.  Eyes:     Extraocular Movements: Extraocular movements intact.  Pulmonary:     Effort: Pulmonary effort is normal.  Genitourinary:    Comments: deferred Neurological:     Mental Status: She is alert and oriented to person, place, and time. Mental status is at baseline.      UC Treatments / Results  Labs (all labs ordered are listed, but only abnormal results are displayed) Labs Reviewed  CERVICOVAGINAL ANCILLARY ONLY    EKG   Radiology No results found.  Procedures Procedures (including critical care time)  Medications Ordered in UC Medications - No data to display  Initial Impression / Assessment and Plan /  UC Course  I have reviewed the triage vital signs and the nursing notes.  Pertinent labs & imaging results that were available during my care of the patient were reviewed by me and considered in my medical decision making (see chart for details).  Vaginal discharge  Prophylactically treating for BV and yeast due to known recurrence, metronidazole and Diflucan prescribed and discussed administration, STI labs are pending, will treat further per protocol, advised abstinence during treatment, advised discontinuation of partner ejaculating into her vagina as it is known trigger however patient is currently trying to get pregnant, may follow-up with his urgent care as needed Final Clinical Impressions(s) / UC Diagnoses   Final diagnoses:  Vaginal discharge   Discharge Instructions   None    ED Prescriptions   None    PDMP not reviewed this encounter.   Valinda Hoar, NP 09/06/22 1507    Valinda Hoar, NP 09/06/22 1507

## 2022-09-07 LAB — CERVICOVAGINAL ANCILLARY ONLY
Bacterial Vaginitis (gardnerella): POSITIVE — AB
Candida Glabrata: NEGATIVE
Candida Vaginitis: POSITIVE — AB
Chlamydia: NEGATIVE
Comment: NEGATIVE
Comment: NEGATIVE
Comment: NEGATIVE
Comment: NEGATIVE
Comment: NEGATIVE
Comment: NORMAL
Neisseria Gonorrhea: NEGATIVE
Trichomonas: NEGATIVE

## 2022-10-12 ENCOUNTER — Encounter (HOSPITAL_COMMUNITY): Payer: Self-pay

## 2022-10-12 ENCOUNTER — Ambulatory Visit (HOSPITAL_COMMUNITY)
Admission: RE | Admit: 2022-10-12 | Discharge: 2022-10-12 | Disposition: A | Discharge status: Home / Self Care | Payer: Medicaid Other | Source: Ambulatory Visit | Attending: Internal Medicine | Admitting: Internal Medicine

## 2022-10-12 VITALS — BP 95/62 | HR 81 | Temp 98.1°F | Resp 14

## 2022-10-12 DIAGNOSIS — N898 Other specified noninflammatory disorders of vagina: Secondary | ICD-10-CM | POA: Insufficient documentation

## 2022-10-12 DIAGNOSIS — N760 Acute vaginitis: Secondary | ICD-10-CM | POA: Insufficient documentation

## 2022-10-12 NOTE — ED Provider Notes (Signed)
[facility]    CSN: [id number] Arrival date & time: 10/12/22  1513      History   Chief Complaint Chief Complaint  Patient presents with   appt 3   Vaginal Discharge    HPI Angela Hayden is a 22 y.o. female.   Patient presents to urgent care for evaluation of vaginal discharge and vaginal odor that started 4-5 days ago. States she took flagyl 2 weeks ago before her menstrual cycle started and states that when her cycle ended, her symptoms returned almost immediately 4 to 5 days ago. No abdominal pain, N/V/D, rash, fever/chills, or urinary symptoms. Denies vaginal itching. No chance of pregnancy, LMP 10/01/22.  No recent new sexual partners or concern for STD.  History of frequent BV infections.  She has an appointment with her OB/GYN tomorrow.   Vaginal Discharge   Past Medical History:  Diagnosis Date   Chlamydia trachomatis infection in pregnancy 07/20/2018   Iron deficiency anemia during pregnancy 07/20/2018    Patient Active Problem List   Diagnosis Date Noted   Postpartum anemia 08/09/2018   Postoperative fever 08/08/2018   Postpartum infection 08/08/2018   Nexplanon in place 07/23/2018    Past Surgical History:  Procedure Laterality Date   CESAREAN SECTION N/A 07/21/2018   Procedure: CESAREAN SECTION;  Surgeon: Adam Phenix, MD;  Location: [facility];  Service: Obstetrics;  Laterality: N/A;    OB History     Gravida  1   Para  1   Term  1   Preterm  0   AB  0   Living  1      SAB  0   IAB  0   Ectopic  0   Multiple  0   Live Births  1            Home Medications    Prior to Admission medications   Medication Sig Start Date End Date Taking? Authorizing Provider  fluconazole (DIFLUCAN) 150 MG tablet Take 1 tablet on day 1, then take 2nd tablet on day 8 09/06/22   White, Elita Boone, NP  metroNIDAZOLE (FLAGYL) 500 MG tablet Take 1 tablet (500 mg total) by mouth 2 (two) times daily. 09/06/22   White, Elita Boone, NP   amLODipine (NORVASC) 2.5 MG tablet Take 1 tablet (2.5 mg total) by mouth daily. Patient not taking: Reported on 10/04/2018 08/15/18 09/28/20  Adam Phenix, MD  ferrous sulfate 325 (65 FE) MG tablet Take 1 tablet (325 mg total) by mouth daily with breakfast. 09/25/18 09/28/20  Adam Phenix, MD    Family History Family History  Problem Relation Age of Onset   Healthy Mother    Healthy Father     Social History Social History   Tobacco Use   Smoking status: Never   Smokeless tobacco: Never  Vaping Use   Vaping Use: Never used  Substance Use Topics   Alcohol use: Yes    Comment: occasionally   Drug use: Yes    Types: Marijuana     Allergies   Patient has no known allergies.   Review of Systems Review of Systems  Genitourinary:  Positive for vaginal discharge.  Per HPI   Physical Exam Triage Vital Signs ED Triage Vitals  Enc Vitals Group     BP 10/12/22 1522 95/62     Pulse Rate 10/12/22 1522 81     Resp 10/12/22 1522 14     Temp 10/12/22 1522 98.1 F (36.7  C)     Temp Source 10/12/22 1522 Oral     SpO2 10/12/22 1522 97 %     Weight --      Height --      Head Circumference --      Peak Flow --      Pain Score 10/12/22 1520 0     Pain Loc --      Pain Edu? --      Excl. in [place]? --    No data found.  Updated Vital Signs BP 95/62 (BP Location: Left Arm)   Pulse 81   Temp 98.1 F (36.7 C) (Oral)   Resp 14   LMP 10/01/2022   SpO2 97%   Visual Acuity Right Eye Distance:   Left Eye Distance:   Bilateral Distance:    Right Eye Near:   Left Eye Near:    Bilateral Near:     Physical Exam Vitals and nursing note reviewed.  Constitutional:      Appearance: She is not ill-appearing or toxic-appearing.  HENT:     Head: Normocephalic and atraumatic.     Right Ear: Hearing and external ear normal.     Left Ear: Hearing and external ear normal.     Nose: Nose normal.     Mouth/Throat:     Lips: Pink.  Eyes:     General: Lids are normal. Vision  grossly intact. Gaze aligned appropriately.     Extraocular Movements: Extraocular movements intact.     Conjunctiva/sclera: Conjunctivae normal.  Pulmonary:     Effort: Pulmonary effort is normal.  Genitourinary:    Comments: Deferred. Musculoskeletal:     Cervical back: Neck supple.  Skin:    General: Skin is warm and dry.     Capillary Refill: Capillary refill takes less than 2 seconds.     Findings: No rash.  Neurological:     General: No focal deficit present.     Mental Status: She is alert and oriented to person, place, and time. Mental status is at baseline.     Cranial Nerves: No dysarthria or facial asymmetry.  Psychiatric:        Mood and Affect: Mood normal.        Speech: Speech normal.        Behavior: Behavior normal.        Thought Content: Thought content normal.        Judgment: Judgment normal.      UC Treatments / Results  Labs (all labs ordered are listed, but only abnormal results are displayed) Labs Reviewed  CERVICOVAGINAL ANCILLARY ONLY    EKG   Radiology No results found.  Procedures Procedures (including critical care time)  Medications Ordered in UC Medications - No data to display  Initial Impression / Assessment and Plan / UC Course  I have reviewed the triage vital signs and the nursing notes.  Pertinent labs & imaging results that were available during my care of the patient were reviewed by me and considered in my medical decision making (see chart for details).   1.  Vaginal discharge Cytology swab is pending, will call patient in 2 to 3 days with positive results and treat as needed.  I would like to wait for the cytology swab to come back to treat for vaginitis due to recent use of Flagyl antibiotic.  If she does test positive for BV on cytology swab from today's visit, discussed using clindamycin instead of Flagyl to treat BV infection.  She is open to this and agreeable with plan.  She sees OB/GYN tomorrow and has been advised  to discuss this further with OB/GYN.  She has tried using women's health probiotics in the past without much relief of symptoms.  Discussed use of boric acid suppositories as well to prevent recurrent BV and use of condoms during sexual intercourse.   Discussed physical exam and available lab work findings in clinic with patient.  Counseled patient regarding appropriate use of medications and potential side effects for all medications recommended or prescribed today. Discussed red flag signs and symptoms of worsening condition,when to call the PCP office, return to urgent care, and when to seek higher level of care in the emergency department. Patient verbalizes understanding and agreement with plan. All questions answered. Patient discharged in stable condition.    Final Clinical Impressions(s) / UC Diagnoses   Final diagnoses:  Vaginal discharge     Discharge Instructions      Your STD testing has been sent to the lab and will come back in the next 2 to 3 days.  We will call you if any of your results are positive requiring treatment and treat you at that time.   If you do not receive a phone call from [place], this means your testing was negative.  Avoid sexual intercourse until your STD results come back.  If any of your STD results are positive, you will need to avoid sexual intercourse for 7 days while you are being treated to prevent spread of STD.  Condom use is the best way to prevent spread of STDs.  Return to urgent care as needed.       ED Prescriptions   None    PDMP not reviewed this encounter.   Carlisle Beers, [place] 10/12/22 1614

## 2022-10-12 NOTE — Discharge Instructions (Signed)

## 2022-10-12 NOTE — ED Triage Notes (Signed)
Pt reports having vaginal discharge with odor for about 4-5 days after menstrual cycle ended. Has appt to [facility] tomorrow.

## 2022-10-13 ENCOUNTER — Telehealth (HOSPITAL_COMMUNITY): Payer: Self-pay | Admitting: Emergency Medicine

## 2022-10-13 ENCOUNTER — Ambulatory Visit (INDEPENDENT_AMBULATORY_CARE_PROVIDER_SITE_OTHER): Payer: Medicaid Other

## 2022-10-13 VITALS — BP 114/74 | HR 80 | Ht 63.0 in | Wt 109.4 lb

## 2022-10-13 DIAGNOSIS — N760 Acute vaginitis: Secondary | ICD-10-CM | POA: Diagnosis not present

## 2022-10-13 DIAGNOSIS — B9689 Other specified bacterial agents as the cause of diseases classified elsewhere: Secondary | ICD-10-CM | POA: Diagnosis not present

## 2022-10-13 DIAGNOSIS — Z1339 Encounter for screening examination for other mental health and behavioral disorders: Secondary | ICD-10-CM | POA: Diagnosis not present

## 2022-10-13 LAB — CERVICOVAGINAL ANCILLARY ONLY
Bacterial Vaginitis (gardnerella): POSITIVE — AB
Candida Glabrata: NEGATIVE
Candida Vaginitis: POSITIVE — AB
Chlamydia: NEGATIVE
Comment: NEGATIVE
Comment: NEGATIVE
Comment: NEGATIVE
Comment: NEGATIVE
Comment: NEGATIVE
Comment: NORMAL
Neisseria Gonorrhea: NEGATIVE
Trichomonas: NEGATIVE

## 2022-10-13 MED ORDER — FLUCONAZOLE 150 MG PO TABS: 150.0000 mg | ORAL_TABLET | Freq: Once | ORAL | 0 refills | Status: AC

## 2022-10-13 MED ORDER — TINIDAZOLE 500 MG PO TABS: 2.0000 g | ORAL_TABLET | Freq: Every day | ORAL | 2 refills | Status: DC

## 2022-10-13 NOTE — Patient Instructions (Addendum)
Boric Acid Vaginal Suppositories What is this medication? BORIC ACID (BOHR ik AS id) may support vaginal health. It may relieve the symptoms of a yeast and bacterial infection, such as itching, burning, and odor.   COMMON BRAND NAME(S): AZO Boric Acid with Aloe Vera, Hylafem  How should I use this medication?  This medication is for use in the vagina. Do not take by mouth. Wash hands before and after use. Use this medication at bedtime, unless otherwise directed. For recurrent Bacterial Vaginosis, after initial treatment with an anti-infective you should start your Boric Acid suppositories.  Insert one 600mg suppository in the vagina every night for 21 days and then once weekly for 9 weeks. After treatment if you have symptoms, treat with Boric Acid suppository for 3-5 days. If symptoms persist or return shortly after, contact the office for an appt.  Boric acid can cause death if consumed orally. Therefore you should use barrier methods during oral sex when undergoing treatment.   What if I miss a dose? If you miss a dose, use it as soon as you can. If it is almost time for your next dose, use only that dose. Do not use double or extra doses.  What may interact with this medication? Interactions are not expected. Do not use any other vaginal products without telling your care team. This list may not describe all possible interactions. Give your health care provider a list of all the medicines, herbs, non-prescription drugs, or dietary supplements you use. Also tell them if you smoke, drink alcohol, or use illegal drugs. Some items may interact with your medicine.  What should I watch for while using this medication? Tell your care team if your symptoms do not start to get better within a few days. It is better not to have sex until you have finished your treatment. This medication may cause condoms, diaphragms, and spermicides to not work as well. Do not rely on any of these methods to prevent  sexually transmitted infections (STIs) or pregnancy while you are using this medication.  Vaginal medications may come out of the vagina during treatment. To keep the medication from getting on your clothing, wear a panty liner, but change frequently to avoid concurrent infections.  The use of tampons is not recommended. To help clear up the infection, wear freshly washed cotton, not synthetic, underwear.  What side effects may I notice from receiving this medication? Side effects that you should report to your care team as soon as possible: Allergic reactions--skin rash, itching, hives, swelling of the face, lips, tongue, or throat Unusual vaginal discharge, itching, or odor Side effects that usually do not require medical attention (report to your care team if they continue or are bothersome): Vaginal irritation at the application site  This list may not describe all possible side effects. Call your doctor for medical advice about side effects. You may report side effects to FDA at 1-800-FDA-1088. Where should I keep my medication? Keep out of the reach of children and pets. Store in a cool, dry place between 15 and 30 degrees C (59 and 86 degrees F). Keep away from sunlight. Throw away any unused medication after the expiration date.  NOTE: This sheet is a summary. It may not cover all possible information. If you have questions about this medicine, talk to your doctor, pharmacist, or health care provider.  2023 Elsevier/Gold Standard (2021-03-09 00:00:00)  

## 2022-10-13 NOTE — Progress Notes (Signed)
   GYNECOLOGY PROBLEM OFFICE VISIT NOTE  History:  Angela Hayden is a 22 y.o. G1P1001 here today for recurrent BV.  She states she completed the medication prior to her cycle starting on the 4th.  She states when her cycle ended she noted odor that is fishy.  She states this infection occurs frequently and she rarely goes a month without it.  She reports usual treatment with metronidazole.   Past Medical History:  Diagnosis Date   Chlamydia trachomatis infection in pregnancy 07/20/2018   Iron deficiency anemia during pregnancy 07/20/2018    Past Surgical History:  Procedure Laterality Date   CESAREAN SECTION N/A 07/21/2018   Procedure: CESAREAN SECTION;  Surgeon: Adam Phenix, MD;  Location: [facility] BIRTHING SUITES;  Service: Obstetrics;  Laterality: N/A;    The following portions of the patient's history were reviewed and updated as appropriate: allergies, current medications, past family history, past medical history, past social history, past surgical history and problem list.   Health Maintenance:  No pap on file d/t age.  No mammogram on file d/t age.   Review of Systems:  Genito-Urinary ROS: no dysuria, trouble voiding, or hematuria Gastrointestinal ROS: negative Objective:  Vitals: BP 114/74   Pulse 80   Ht 5\' 3"  (1.6 m)   Wt 109 lb 6.4 oz (49.6 kg)   LMP 10/01/2022   BMI 19.38 kg/m   Physical Exam: Physical Exam Constitutional:      Appearance: Normal appearance.  HENT:     Head: Normocephalic and atraumatic.  Eyes:     Conjunctiva/sclera: Conjunctivae normal.  Cardiovascular:     Rate and Rhythm: Normal rate.  Musculoskeletal:        General: Normal range of motion.     Cervical back: Normal range of motion.  Neurological:     Mental Status: She is alert and oriented to person, place, and time.  Psychiatric:        Mood and Affect: Mood normal.        Behavior: Behavior normal.      Labs and Imaging: No results found.  Assessment & Plan:  22 year  old Recurrent BV  -CV pending from recent UC visit.  -Discussed presumptive treatment for BV based on history. -Discussed treatment with metrogel and patient declines. -Reviewed treatment with tinidazole as patient has been unresponsive to metronidazole. -Educated on Boric Acid regimen for treatment and management of recurrent BV. Reviewed this providers preferred method. -Informed of need to obtain 600mg  boric acid via internet or pharmacy. -Instructions placed in patient AVS. -Instructed to not start tinidazole regimen until boric acid obtained and then start after completion of 2nd dose of tinidazole. -Patient verbalizes understanding and without questions or concerns. -Patient expresses desire for conception.  Instructed to either forego usage of boric acid or discontinue attempts to conceive until complete. -Patient will discontinue attempts. -Discussed starting PNV once desiring to conceive.  -Plan to RTO in 3-4 months for annual exam with pap smear.    Total face-to-face time with patient: 15 minutes   Gerrit Heck, [place] 10/13/2022 10:51 AM

## 2022-10-13 NOTE — Progress Notes (Signed)
NGYN presents for recurring BV. Pt was seen at urgent care yesterday, swab done. Pt c/o decreased appetite over the last month. Pt declines STD testing and BC.

## 2022-10-18 ENCOUNTER — Other Ambulatory Visit: Payer: Self-pay

## 2022-10-18 DIAGNOSIS — B9689 Other specified bacterial agents as the cause of diseases classified elsewhere: Secondary | ICD-10-CM

## 2022-10-18 MED ORDER — TINIDAZOLE 500 MG PO TABS: 2.0000 g | ORAL_TABLET | Freq: Every day | ORAL | 2 refills | Status: DC

## 2022-11-13 ENCOUNTER — Emergency Department (HOSPITAL_COMMUNITY)
Admission: EM | Admit: 2022-11-13 | Discharge: 2022-11-13 | Disposition: A | Discharge status: Home / Self Care | Payer: Medicaid Other | Attending: Emergency Medicine | Admitting: Emergency Medicine

## 2022-11-13 ENCOUNTER — Other Ambulatory Visit: Payer: Self-pay

## 2022-11-13 ENCOUNTER — Encounter (HOSPITAL_COMMUNITY): Payer: Self-pay | Admitting: Emergency Medicine

## 2022-11-13 DIAGNOSIS — W448XXA Other foreign body entering into or through a natural orifice, initial encounter: Secondary | ICD-10-CM | POA: Insufficient documentation

## 2022-11-13 DIAGNOSIS — T192XXA Foreign body in vulva and vagina, initial encounter: Secondary | ICD-10-CM | POA: Diagnosis present

## 2022-11-13 NOTE — ED Provider Notes (Signed)
[place] EMERGENCY DEPARTMENT AT [facility] Provider Note   CSN: [id number] Arrival date & time: 11/13/22  8413     History  Chief Complaint  Angela Hayden presents with   Vaginal Exam    Angela Hayden is a 22 y.o. female.  HPI  Angela Hayden is a 22 year old female present emergency room today with vaginal foreign body.  She states that she did not remove the tampon before having sex.  She states that she is currently undergoing treatment for BV.  She is scheduled to follow-up with a gynecologist but has not seen them yet.  She denies any vaginal pain or bleeding.  No abdominal pain nausea vomiting no other associate symptoms simply foreign body sensation in her vaginal canal.      Home Medications Prior to Admission medications   Medication Sig Start Date End Date Taking? Authorizing Provider  tinidazole (TINDAMAX) 500 MG tablet Take 4 tablets (2,000 mg total) by mouth daily with breakfast. For two days 10/18/22   Gerrit Heck, CNM  amLODipine (NORVASC) 2.5 MG tablet Take 1 tablet (2.5 mg total) by mouth daily. Angela Hayden not taking: Reported on 10/04/2018 08/15/18 09/28/20  Adam Phenix, MD  ferrous sulfate 325 (65 FE) MG tablet Take 1 tablet (325 mg total) by mouth daily with breakfast. 09/25/18 09/28/20  Adam Phenix, MD      Allergies    Angela Hayden has no known allergies.    Review of Systems   Review of Systems  Physical Exam Updated Vital Signs BP 118/77   Pulse 93   Temp 97.8 F (36.6 C) (Oral)   Resp 16   Wt 49.4 kg   LMP 10/29/2022   SpO2 100%   BMI 19.31 kg/m  Physical Exam Vitals and nursing note reviewed.  Constitutional:      General: She is not in acute distress.    Appearance: Normal appearance. She is not ill-appearing.  HENT:     Head: Normocephalic and atraumatic.     Mouth/Throat:     Mouth: Mucous membranes are moist.  Eyes:     General: No scleral icterus.       Right eye: No discharge.        Left eye: No discharge.      Conjunctiva/sclera: Conjunctivae normal.  Pulmonary:     Effort: Pulmonary effort is normal.     Breath sounds: No stridor.  Abdominal:     Tenderness: There is no abdominal tenderness.  Genitourinary:    Comments: Tampon present in vaginal canal Skin:    General: Skin is warm and dry.  Neurological:     Mental Status: She is alert and oriented to person, place, and time. Mental status is at baseline.  Psychiatric:        Mood and Affect: Mood normal.     ED Results / Procedures / Treatments   Labs (all labs ordered are listed, but only abnormal results are displayed) Labs Reviewed - No data to display  EKG None  Radiology No results found.  Procedures .Foreign Body Removal  Date/Time: 11/13/2022 4:13 AM  Performed by: Gailen Shelter, PA Authorized by: Gailen Shelter, PA  Consent: Verbal consent obtained. Consent given by: Angela Hayden Angela Hayden understanding: Angela Hayden states understanding of the procedure being performed Angela Hayden consent: the Angela Hayden's understanding of the procedure matches consent given Procedure consent: procedure consent matches procedure scheduled Relevant documents: relevant documents present and verified Test results: test results available and properly labeled Imaging studies: imaging studies available  Angela Hayden identity confirmed: verbally with Angela Hayden and arm band Body area: vagina  Sedation: Angela Hayden sedated: no  Angela Hayden restrained: no Localization method: speculum Removal mechanism: ring forceps Complexity: simple 1 objects recovered. Objects recovered: tampon Post-procedure assessment: foreign body removed Angela Hayden tolerance: Angela Hayden tolerated the procedure well with no immediate complications      Medications Ordered in ED Medications - No data to display  ED Course/ Medical Decision Making/ A&P                             Medical Decision Making  Angela Hayden is a 22 year old female present emergency room today with vaginal foreign  body.  She states that she did not remove the tampon before having sex.  She states that she is currently undergoing treatment for BV.  She is scheduled to follow-up with a gynecologist but has not seen them yet.  She denies any vaginal pain or bleeding.  No abdominal pain nausea vomiting no other associate symptoms simply foreign body sensation in her vaginal canal.   1 tampon removed from vaginal canal.  After removal of foreign body I repeated speculum exam without any residual foreign body.  Angela Hayden tolerated procedure well.  Will discharge home.  Return precautions discussed, follow-up with OB/GYN for your regularly scheduled  Final Clinical Impression(s) / ED Diagnoses Final diagnoses:  Foreign body in vagina, initial encounter    Rx / DC Orders ED Discharge Orders     None         Gailen Shelter, [place] 11/13/22 0414    Zadie Rhine, MD 11/13/22 [id number]

## 2022-11-13 NOTE — Discharge Instructions (Signed)
Follow-up with your OBGYN

## 2022-11-13 NOTE — ED Triage Notes (Signed)
Pt in reporting she has a tampon stuck in her vagina (placed yesterday), but states she cannot feel the string to retrieve it. LMP start on 6/1 and period had ended, but pt has BV and lots of odor and white discharge and placed the tampon because of this. Finished Tinidazole yesterday

## 2022-11-29 ENCOUNTER — Ambulatory Visit (HOSPITAL_COMMUNITY)
Admission: EM | Admit: 2022-11-29 | Discharge: 2022-11-29 | Disposition: A | Discharge status: Home / Self Care | Payer: Medicaid Other | Attending: Emergency Medicine | Admitting: Emergency Medicine

## 2022-11-29 ENCOUNTER — Other Ambulatory Visit: Payer: Self-pay

## 2022-11-29 ENCOUNTER — Encounter (HOSPITAL_COMMUNITY): Payer: Self-pay | Admitting: Emergency Medicine

## 2022-11-29 DIAGNOSIS — Z113 Encounter for screening for infections with a predominantly sexual mode of transmission: Secondary | ICD-10-CM | POA: Diagnosis not present

## 2022-11-29 DIAGNOSIS — N760 Acute vaginitis: Secondary | ICD-10-CM | POA: Insufficient documentation

## 2022-11-29 LAB — HIV ANTIBODY (ROUTINE TESTING W REFLEX): HIV Screen 4th Generation wRfx: NONREACTIVE

## 2022-11-29 MED ORDER — METRONIDAZOLE 500 MG PO TABS: 500.0000 mg | ORAL_TABLET | Freq: Two times a day (BID) | ORAL | 0 refills | Status: DC

## 2022-11-29 NOTE — ED Triage Notes (Signed)
Pt c/o vaginal discharge since 06/27. Concern about vaginal infection.

## 2022-11-29 NOTE — ED Provider Notes (Signed)
[facility]    CSN: [id number] Arrival date & time: 11/29/22  1101      History   Chief Complaint Chief Complaint  Patient presents with   Vaginal Discharge    Pt c/o vaginal discharge since 06/27. Concern about vaginal infection.     HPI Angela Hayden is a 22 y.o. female.   Patient reports she does have vaginal discharge and irritation that has been ongoing.  She was recently treated for BV, had intercourse while obtaining treatment for BV and while she was on her menses, had to go to the emergency department for removal of her tampon.  She reports not finishing her Flagyl for bacterial vaginosis.  She denies any dysuria, fevers, flank pain, nausea or vomiting.  Denies any vaginal sores or lesions.  Reports her last menstrual cycle was 28 June.  And is recently finished.  Low concern for pregnancy.  Would like screening for HIV and syphilis.     The history is provided by the patient and medical records.  Vaginal Discharge Associated symptoms: no dysuria and no fever     Past Medical History:  Diagnosis Date   Chlamydia trachomatis infection in pregnancy 07/20/2018   Iron deficiency anemia during pregnancy 07/20/2018    Patient Active Problem List   Diagnosis Date Noted   Postpartum anemia 08/09/2018   Postoperative fever 08/08/2018   Postpartum infection 08/08/2018   Nexplanon in place 07/23/2018    Past Surgical History:  Procedure Laterality Date   CESAREAN SECTION N/A 07/21/2018   Procedure: CESAREAN SECTION;  Surgeon: Adam Phenix, MD;  Location: [facility] BIRTHING SUITES;  Service: Obstetrics;  Laterality: N/A;    OB History     Gravida  1   Para  1   Term  1   Preterm  0   AB  0   Living  1      SAB  0   IAB  0   Ectopic  0   Multiple  0   Live Births  1            Home Medications    Prior to Admission medications   Medication Sig Start Date End Date Taking? Authorizing Provider  metroNIDAZOLE (FLAGYL) 500 MG  tablet Take 1 tablet (500 mg total) by mouth 2 (two) times daily. 11/29/22  Yes Rinaldo Ratel, [place] N, FNP  tinidazole (TINDAMAX) 500 MG tablet Take 4 tablets (2,000 mg total) by mouth daily with breakfast. For two days 10/18/22   Gerrit Heck, CNM  amLODipine (NORVASC) 2.5 MG tablet Take 1 tablet (2.5 mg total) by mouth daily. Patient not taking: Reported on 10/04/2018 08/15/18 09/28/20  Adam Phenix, MD  ferrous sulfate 325 (65 FE) MG tablet Take 1 tablet (325 mg total) by mouth daily with breakfast. 09/25/18 09/28/20  Adam Phenix, MD    Family History Family History  Problem Relation Age of Onset   Healthy Mother    Healthy Father     Social History Social History   Tobacco Use   Smoking status: Never   Smokeless tobacco: Never  Vaping Use   Vaping Use: Never used  Substance Use Topics   Alcohol use: Yes    Comment: occasionally   Drug use: Not Currently    Types: Marijuana     Allergies   Patient has no known allergies.   Review of Systems Review of Systems  Constitutional:  Negative for fever.  Genitourinary:  Positive for vaginal discharge. Negative for  dysuria, flank pain, frequency, genital sores and menstrual problem.     Physical Exam Triage Vital Signs ED Triage Vitals  Enc Vitals Group     BP 11/29/22 1117 104/65     Pulse Rate 11/29/22 1117 85     Resp 11/29/22 1117 16     Temp 11/29/22 1117 98.2 F (36.8 C)     Temp Source 11/29/22 1117 Oral     SpO2 11/29/22 1117 98 %     Weight 11/29/22 1118 110 lb 3.7 oz (50 kg)     Height 11/29/22 1118 5\' 3"  (1.6 m)     Head Circumference --      Peak Flow --      Pain Score 11/29/22 1118 0     Pain Loc --      Pain Edu? --      Excl. in GC? --    No data found.  Updated Vital Signs BP 104/65 (BP Location: Right Arm)   Pulse 85   Temp 98.2 F (36.8 C) (Oral)   Resp 16   Ht 5\' 3"  (1.6 m)   Wt 110 lb 3.7 oz (50 kg)   LMP 10/29/2022   SpO2 98%   BMI 19.53 kg/m   Visual Acuity Right Eye Distance:    Left Eye Distance:   Bilateral Distance:    Right Eye Near:   Left Eye Near:    Bilateral Near:     Physical Exam Vitals and nursing note reviewed.  Constitutional:      Appearance: Normal appearance.  HENT:     Head: Normocephalic and atraumatic.     Right Ear: External ear normal.     Left Ear: External ear normal.     Nose: Nose normal.     Mouth/Throat:     Mouth: Mucous membranes are moist.  Eyes:     General:        Right eye: No discharge.     Conjunctiva/sclera: Conjunctivae normal.  Cardiovascular:     Rate and Rhythm: Normal rate.  Pulmonary:     Effort: Pulmonary effort is normal. No respiratory distress.  Musculoskeletal:        General: Normal range of motion.  Neurological:     General: No focal deficit present.     Mental Status: She is alert and oriented to person, place, and time.  Psychiatric:        Mood and Affect: Mood normal.      UC Treatments / Results  Labs (all labs ordered are listed, but only abnormal results are displayed) Labs Reviewed  RPR  HIV ANTIBODY (ROUTINE TESTING W REFLEX)  CERVICOVAGINAL ANCILLARY ONLY    EKG   Radiology No results found.  Procedures Procedures (including critical care time)  Medications Ordered in UC Medications - No data to display  Initial Impression / Assessment and Plan / UC Course  I have reviewed the triage vital signs and the nursing notes.  Pertinent labs & imaging results that were available during my care of the patient were reviewed by me and considered in my medical decision making (see chart for details).  Vitals and triage reviewed, patient is hemodynamically stable.  Patient reports symptoms consistent with BV and did not finish recent Flagyl course.  Cytology swab as well as HIV and syphilis screening obtained, will retreat with Flagyl.  Encouraged compliance.  Staff to contact if we need to modify treatment.  Without signs of urinary tract infection such as dysuria, flank  pain,  nausea, vomiting or suprapubic pain.  Will defer urinalysis as patient is unable to urinate in clinic.  Plan of care, follow-up care and return precautions given, no questions at this time.     Final Clinical Impressions(s) / UC Diagnoses   Final diagnoses:  Acute vaginitis  Screening examination for sexually transmitted disease     Discharge Instructions      Please take the Flagyl as prescribed and until finished.  You can take it with food to prevent gastrointestinal upset.  Do not drink alcohol with this medication or it may make you vomit.  Abstain from intercourse until Flagyl has been completed and all results have been received.  Staff will contact you if we need to modify your treatment plan.  To help prevent bacterial vaginosis and the future, ensure you are urinating after intercourse or showering, using an unscented soap, cotton underwear, and you can also consider starting an over-the-counter probiotic supplement with lactobacillus in it, commonly marketed for women's health.  Please follow-up with an OB/GYN for further evaluation.  Any new or concerning symptoms.      ED Prescriptions     Medication Sig Dispense Auth. Provider   metroNIDAZOLE (FLAGYL) 500 MG tablet Take 1 tablet (500 mg total) by mouth 2 (two) times daily. 14 tablet Takari Duncombe, [place], [place]      PDMP not reviewed this encounter.   Trypp Heckmann, [place] N, [place] 11/29/22 1130

## 2022-11-29 NOTE — Discharge Instructions (Addendum)
Please take the Flagyl as prescribed and until finished.  You can take it with food to prevent gastrointestinal upset.  Do not drink alcohol with this medication or it may make you vomit.  Abstain from intercourse until Flagyl has been completed and all results have been received.  Staff will contact you if we need to modify your treatment plan.  To help prevent bacterial vaginosis and the future, ensure you are urinating after intercourse or showering, using an unscented soap, cotton underwear, and you can also consider starting an over-the-counter probiotic supplement with lactobacillus in it, commonly marketed for women's health.  Please follow-up with an OB/GYN for further evaluation.  Any new or concerning symptoms.

## 2022-11-30 LAB — CERVICOVAGINAL ANCILLARY ONLY
Bacterial Vaginitis (gardnerella): POSITIVE — AB
Candida Glabrata: NEGATIVE
Candida Vaginitis: NEGATIVE
Chlamydia: NEGATIVE
Comment: NEGATIVE
Comment: NEGATIVE
Comment: NEGATIVE
Comment: NEGATIVE
Comment: NEGATIVE
Comment: NORMAL
Neisseria Gonorrhea: NEGATIVE
Trichomonas: NEGATIVE

## 2022-11-30 LAB — RPR: RPR Ser Ql: NONREACTIVE

## 2022-12-12 ENCOUNTER — Telehealth (HOSPITAL_COMMUNITY): Payer: Self-pay | Admitting: Emergency Medicine

## 2022-12-12 MED ORDER — FLUCONAZOLE 150 MG PO TABS: 150.0000 mg | ORAL_TABLET | Freq: Once | ORAL | 0 refills | Status: AC

## 2022-12-12 NOTE — Telephone Encounter (Signed)
Patient requested antibiotic associated yeast medicine from recent Metronidazole Sent per protocol Reviewed with patient, verified pharmacy, prescription sent

## 2023-01-08 ENCOUNTER — Ambulatory Visit (HOSPITAL_COMMUNITY): Payer: Medicaid Other

## 2023-01-08 ENCOUNTER — Encounter (HOSPITAL_COMMUNITY): Payer: Self-pay

## 2023-01-08 ENCOUNTER — Ambulatory Visit (HOSPITAL_COMMUNITY)
Admission: EM | Admit: 2023-01-08 | Discharge: 2023-01-08 | Disposition: A | Discharge status: Home / Self Care | Payer: Medicaid Other | Attending: Physician Assistant | Admitting: Physician Assistant

## 2023-01-08 DIAGNOSIS — N898 Other specified noninflammatory disorders of vagina: Secondary | ICD-10-CM | POA: Insufficient documentation

## 2023-01-08 MED ORDER — METRONIDAZOLE 500 MG PO TABS: 500.0000 mg | ORAL_TABLET | Freq: Two times a day (BID) | ORAL | 0 refills | Status: AC

## 2023-01-08 NOTE — ED Provider Notes (Signed)
[facility]    CSN: [id number] Arrival date & time: 01/08/23  1505      History   Chief Complaint Chief Complaint  Patient presents with   Vaginal Discharge    HPI Angela Hayden is a 22 y.o. female.   Patient complains of increased vaginal discharge that started about 3 days ago.  Complains of odor as well.  She has a history of recurrent BV and reports today symptoms feel similar.  She is also requesting STD testing today.  Denies pelvic pain, fever, chills, vomiting, nausea, belly pain.    Past Medical History:  Diagnosis Date   Chlamydia trachomatis infection in pregnancy 07/20/2018   Iron deficiency anemia during pregnancy 07/20/2018    Patient Active Problem List   Diagnosis Date Noted   Postpartum anemia 08/09/2018   Postoperative fever 08/08/2018   Postpartum infection 08/08/2018   Nexplanon in place 07/23/2018    Past Surgical History:  Procedure Laterality Date   CESAREAN SECTION N/A 07/21/2018   Procedure: CESAREAN SECTION;  Surgeon: Adam Phenix, MD;  Location: [facility]IRTHING SUIT[facility];  Service: Obstetrics;  Laterality: N/A;    OB History     Gravida  1   Para  1   Term  1   Preterm  0   AB  0   Living  1      SAB  0   IAB  0   Ectopic  0   Multiple  0   Live Births  1            Home Medications    Prior to Admission medications   Medication Sig Start Date End Date Taking? Authorizing Provider  metroNIDAZOLE (FLAGYL) 500 MG tablet Take 1 tablet (500 mg total) by mouth 2 (two) times daily for 7 days. 01/08/23 01/15/23 Yes Ward, Tylene Fantasia, PA-C  tinidazole (TINDAMAX) 500 MG tablet Take 4 tablets (2,000 mg total) by mouth daily with breakfast. For two days 10/18/22   Gerrit Heck, CNM  amLODipine (NORVASC) 2.5 MG tablet Take 1 tablet (2.5 mg total) by mouth daily. Patient not taking: Reported on 10/04/2018 08/15/18 09/28/20  Adam Phenix, MD  ferrous sulfate 325 (65 FE) MG tablet Take 1 tablet (325 mg total) by mouth  daily with breakfast. 09/25/18 09/28/20  Adam Phenix, MD    Family History Family History  Problem Relation Age of Onset   Healthy Mother    Healthy Father     Social History Social History   Tobacco Use   Smoking status: Never   Smokeless tobacco: Never  Vaping Use   Vaping status: Never Used  Substance Use Topics   Alcohol use: Yes    Comment: occasionally   Drug use: Not Currently    Types: Marijuana     Allergies   Patient has no known allergies.   Review of Systems Review of Systems  Constitutional:  Negative for chills and fever.  HENT:  Negative for ear pain and sore throat.   Eyes:  Negative for pain and visual disturbance.  Respiratory:  Negative for cough and shortness of breath.   Cardiovascular:  Negative for chest pain and palpitations.  Gastrointestinal:  Negative for abdominal pain and vomiting.  Genitourinary:  Positive for vaginal discharge. Negative for dysuria and hematuria.  Musculoskeletal:  Negative for arthralgias and back pain.  Skin:  Negative for color change and rash.  Neurological:  Negative for seizures and syncope.  All other systems reviewed and are  negative.    Physical Exam Triage Vital Signs ED Triage Vitals  Encounter Vitals Group     BP 01/08/23 1602 113/67     Systolic BP Percentile --      Diastolic BP Percentile --      Pulse Rate 01/08/23 1602 72     Resp 01/08/23 1602 18     Temp 01/08/23 1602 98.6 F (37 C)     Temp Source 01/08/23 1602 Oral     SpO2 01/08/23 1602 98 %     Weight 01/08/23 1601 109 lb (49.4 kg)     Height 01/08/23 1601 5\' 2"  (1.575 m)     Head Circumference --      Peak Flow --      Pain Score 01/08/23 1601 0     Pain Loc --      Pain Education --      Exclude from Growth Chart --    No data found.  Updated Vital Signs BP 113/67 (BP Location: Right Arm)   Pulse 72   Temp 98.6 F (37 C) (Oral)   Resp 18   Ht 5\' 2"  (1.575 m)   Wt 109 lb (49.4 kg)   LMP 01/03/2023 (Approximate)    SpO2 98%   BMI 19.94 kg/m   Visual Acuity Right Eye Distance:   Left Eye Distance:   Bilateral Distance:    Right Eye Near:   Left Eye Near:    Bilateral Near:     Physical Exam Vitals and nursing note reviewed.  Constitutional:      General: She is not in acute distress.    Appearance: She is well-developed.  HENT:     Head: Normocephalic and atraumatic.  Eyes:     Conjunctiva/sclera: Conjunctivae normal.  Cardiovascular:     Rate and Rhythm: Normal rate and regular rhythm.     Heart sounds: No murmur heard. Pulmonary:     Effort: Pulmonary effort is normal. No respiratory distress.     Breath sounds: Normal breath sounds.  Abdominal:     Palpations: Abdomen is soft.     Tenderness: There is no abdominal tenderness.  Musculoskeletal:        General: No swelling.     Cervical back: Neck supple.  Skin:    General: Skin is warm and dry.     Capillary Refill: Capillary refill takes less than 2 seconds.  Neurological:     Mental Status: She is alert.  Psychiatric:        Mood and Affect: Mood normal.      UC Treatments / Results  Labs (all labs ordered are listed, but only abnormal results are displayed) Labs Reviewed  CERVICOVAGINAL ANCILLARY ONLY    EKG   Radiology No results found.  Procedures Procedures (including critical care time)  Medications Ordered in UC Medications - No data to display  Initial Impression / Assessment and Plan / UC Course  I have reviewed the triage vital signs and the nursing notes.  Pertinent labs & imaging results that were available during my care of the patient were reviewed by me and considered in my medical decision making (see chart for details).     Will treat for suspected BV with Flagyl.  Cervicovaginal swab in clinic today, pending results, will change treatment plan of indicated.  Status Final Clinical Impressions(s) / UC Diagnoses   Final diagnoses:  Vaginal discharge     Discharge Instructions       Take Flagyl  as prescribed. Will call with test results and change treatment plan if indicated based on results.      ED Prescriptions     Medication Sig Dispense Auth. Provider   metroNIDAZOLE (FLAGYL) 500 MG tablet Take 1 tablet (500 mg total) by mouth 2 (two) times daily for 7 days. 14 tablet Ward, Tylene Fantasia, PA-C      PDMP not reviewed this encounter.   Ward, Tylene Fantasia, PA-C 01/08/23 1620

## 2023-01-08 NOTE — Discharge Instructions (Signed)
Take Flagyl as prescribed. Will call with test results and change treatment plan if indicated based on results.

## 2023-01-08 NOTE — ED Triage Notes (Signed)
Patient here today with c/o vaginal discharge and odor X 3 days. Patient has a h/o BV.

## 2023-01-10 ENCOUNTER — Telehealth (HOSPITAL_COMMUNITY): Payer: Self-pay | Admitting: *Deleted

## 2023-01-10 MED ORDER — FLUCONAZOLE 150 MG PO TABS: 150.0000 mg | ORAL_TABLET | Freq: Once | ORAL | 0 refills | Status: AC

## 2023-01-10 NOTE — Telephone Encounter (Signed)
Diflucan for positive yeast sent, Metronidazole for BV sent on day of visit

## 2023-01-10 NOTE — Telephone Encounter (Signed)
Pt states she seen her results on mychart and would like rx called in to [facility] on [address].
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
## 2023-02-17 ENCOUNTER — Encounter: Payer: Self-pay | Admitting: Obstetrics and Gynecology

## 2023-02-17 ENCOUNTER — Ambulatory Visit (INDEPENDENT_AMBULATORY_CARE_PROVIDER_SITE_OTHER): Payer: Medicaid Other | Admitting: Obstetrics and Gynecology

## 2023-02-17 ENCOUNTER — Other Ambulatory Visit (HOSPITAL_COMMUNITY)
Admission: RE | Admit: 2023-02-17 | Discharge: 2023-02-17 | Disposition: A | Discharge status: Home / Self Care | Payer: Medicaid Other | Source: Ambulatory Visit | Attending: Obstetrics and Gynecology | Admitting: Obstetrics and Gynecology

## 2023-02-17 VITALS — BP 108/69 | HR 87 | Wt 106.3 lb

## 2023-02-17 DIAGNOSIS — Z01419 Encounter for gynecological examination (general) (routine) without abnormal findings: Secondary | ICD-10-CM | POA: Insufficient documentation

## 2023-02-17 DIAGNOSIS — Z202 Contact with and (suspected) exposure to infections with a predominantly sexual mode of transmission: Secondary | ICD-10-CM | POA: Diagnosis present

## 2023-02-17 NOTE — Progress Notes (Signed)
Angela Hayden is a 22 y.o. G50P1001 female here for a routine annual gynecologic exam.  Current complaints: recurrent BV.   Denies abnormal vaginal bleeding, discharge, pelvic pain, problems with intercourse or other gynecologic concerns.    Gynecologic History No LMP recorded. Contraception: none Last Pap: NA. Last mammogram: NA.   Obstetric History OB History  Gravida Para Term Preterm AB Living  1 1 1  0 0 1  SAB IAB Ectopic Multiple Live Births  0 0 0 0 1    # Outcome Date GA Lbr Len/2nd Weight Sex Type Anes PTL Lv  1 Term 07/21/18 [redacted]w[redacted]d  10 lb 5 oz (4.678 kg) M CS-LTranv   LIV    Past Medical History:  Diagnosis Date   Chlamydia trachomatis infection in pregnancy 07/20/2018   Iron deficiency anemia during pregnancy 07/20/2018    Past Surgical History:  Procedure Laterality Date   CESAREAN SECTION N/A 07/21/2018   Procedure: CESAREAN SECTION;  Surgeon: Adam Phenix, MD;  Location: [facility] BIRTHING SUITES;  Service: Obstetrics;  Laterality: N/A;    Current Outpatient Medications on File Prior to Visit  Medication Sig Dispense Refill   [DISCONTINUED] amLODipine (NORVASC) 2.5 MG tablet Take 1 tablet (2.5 mg total) by mouth daily. (Patient not taking: Reported on 10/04/2018) 20 tablet 1   [DISCONTINUED] ferrous sulfate 325 (65 FE) MG tablet Take 1 tablet (325 mg total) by mouth daily with breakfast. 30 tablet 2   No current facility-administered medications on file prior to visit.    No Known Allergies  Social History   Socioeconomic History   Marital status: Single    Spouse name: Not on file   Number of children: Not on file   Years of education: Not on file   Highest education level: Not on file  Occupational History   Not on file  Tobacco Use   Smoking status: Never   Smokeless tobacco: Never  Vaping Use   Vaping status: Never Used  Substance and Sexual Activity   Alcohol use: Yes    Comment: occasionally   Drug use: Not Currently    Types: Marijuana    Sexual activity: Yes    Birth control/protection: None  Other Topics Concern   Not on file  Social History Narrative   Not on file   Social Determinants of Health   Financial Resource Strain: Not on file  Food Insecurity: Not on file  Transportation Needs: Not on file  Physical Activity: Not on file  Stress: Not on file  Social Connections: Unknown (10/03/2022)   Received from [facility], [facility]   Social Network    Social Network: Not on file  Intimate Partner Violence: Unknown (10/03/2022)   Received from [employer], [facility]   HITS    Physically Hurt: Not on file    Insult or Talk Down To: Not on file    Threaten Physical Harm: Not on file    Scream or Curse: Not on file    Family History  Problem Relation Age of Onset   Healthy Mother    Healthy Father     The following portions of the patient's history were reviewed and updated as appropriate: allergies, current medications, past family history, past medical history, past social history, past surgical history and problem list.  Review of Systems Pertinent items noted in HPI and remainder of comprehensive ROS otherwise negative.   Objective:  BP 108/69   Pulse 87   Wt 106 lb 4.8 oz (48.2 kg)  BMI 19.44 kg/m  Chaperone present CONSTITUTIONAL: Well-developed, well-nourished female in no acute distress.  HENT:  Normocephalic, atraumatic, External right and left ear normal. Oropharynx is clear and moist EYES: Conjunctivae and EOM are normal. Pupils are equal, round, and reactive to light. No scleral icterus.  NECK: Normal range of motion, supple, no masses.  Normal thyroid.  SKIN: Skin is warm and dry. No rash noted. Not diaphoretic. No erythema. No pallor. NEUROLGIC: Alert and oriented to person, place, and time. Normal reflexes, muscle tone coordination. No cranial nerve deficit noted. PSYCHIATRIC: Normal mood and affect. Normal behavior. Normal judgment and thought content. CARDIOVASCULAR: Normal  heart rate noted, regular rhythm RESPIRATORY: Clear to auscultation bilaterally. Effort and breath sounds normal, no problems with respiration noted. BREASTS:Deferred ABDOMEN: Soft, normal bowel sounds, no distention noted.  No tenderness, rebound or guarding.  PELVIC: Normal appearing external genitalia; normal appearing vaginal mucosa and cervix.  No abnormal discharge noted.  Pap smear obtained.  Normal uterine size, no other palpable masses, no uterine or adnexal tenderness. MUSCULOSKELETAL: Normal range of motion. No tenderness.  No cyanosis, clubbing, or edema.  2+ distal pulses.   Assessment:  Annual gynecologic examination with pap smear  Plan:  Will follow up results of pap smear and manage accordingly. STD testing as per pt request Reviewed recurrent BV. Will use Boric acid for 2 months. Advised on reframing from IC during use of Boric acid Routine preventative health maintenance measures emphasized. Please refer to After Visit Summary for other counseling recommendations.    Hermina Staggers, MD, FACOG Attending Obstetrician & Gynecologist Center for [facility], [facility]

## 2023-02-17 NOTE — Progress Notes (Signed)
Pt. Presents for recurrent bv: symptoms include discharge and odor. Pt. States that she started her period today.

## 2023-02-17 NOTE — Progress Notes (Deleted)
.  mley

## 2023-02-18 LAB — HEPATITIS C ANTIBODY: Hep C Virus Ab: NONREACTIVE

## 2023-02-18 LAB — HIV ANTIBODY (ROUTINE TESTING W REFLEX): HIV Screen 4th Generation wRfx: NONREACTIVE

## 2023-02-18 LAB — RPR: RPR Ser Ql: NONREACTIVE

## 2023-02-18 LAB — HEPATITIS B SURFACE ANTIGEN: Hepatitis B Surface Ag: NEGATIVE

## 2023-02-20 LAB — CERVICOVAGINAL ANCILLARY ONLY
Candida Glabrata: NEGATIVE
Candida Vaginitis: NEGATIVE
Chlamydia: NEGATIVE
Comment: NEGATIVE
Comment: NEGATIVE
Comment: NEGATIVE
Comment: NEGATIVE
Comment: NORMAL
Neisseria Gonorrhea: NEGATIVE
Trichomonas: NEGATIVE

## 2023-02-21 LAB — CYTOLOGY - PAP
Comment: NEGATIVE
Diagnosis: NEGATIVE
High risk HPV: NEGATIVE

## 2023-02-22 ENCOUNTER — Other Ambulatory Visit: Payer: Self-pay

## 2023-02-22 DIAGNOSIS — B9689 Other specified bacterial agents as the cause of diseases classified elsewhere: Secondary | ICD-10-CM

## 2023-02-22 MED ORDER — METRONIDAZOLE 500 MG PO TABS
500.0000 mg | ORAL_TABLET | Freq: Two times a day (BID) | ORAL | 0 refills | Status: DC
Start: 2023-02-22 — End: 2023-08-02

## 2023-03-30 ENCOUNTER — Ambulatory Visit: Payer: Medicaid Other | Admitting: Obstetrics and Gynecology

## 2023-04-24 ENCOUNTER — Ambulatory Visit (HOSPITAL_COMMUNITY): Payer: Medicaid Other

## 2023-07-23 ENCOUNTER — Ambulatory Visit (HOSPITAL_COMMUNITY): Payer: Medicaid Other

## 2023-08-02 ENCOUNTER — Other Ambulatory Visit (HOSPITAL_COMMUNITY)
Admission: RE | Admit: 2023-08-02 | Discharge: 2023-08-02 | Disposition: A | Discharge status: Home / Self Care | Source: Ambulatory Visit | Attending: Obstetrics and Gynecology | Admitting: Obstetrics and Gynecology

## 2023-08-02 ENCOUNTER — Ambulatory Visit: Payer: Medicaid Other | Admitting: Obstetrics and Gynecology

## 2023-08-02 ENCOUNTER — Encounter: Payer: Self-pay | Admitting: Obstetrics and Gynecology

## 2023-08-02 VITALS — BP 99/68 | HR 153 | Ht 61.0 in | Wt 109.0 lb

## 2023-08-02 DIAGNOSIS — N898 Other specified noninflammatory disorders of vagina: Secondary | ICD-10-CM | POA: Insufficient documentation

## 2023-08-02 NOTE — Progress Notes (Signed)
 23 y.o. GYN presents for Vaginal discharge, odor.  Denies urinary Sx.  Denies pain.  Last PAP 02/17/2023 NILM.

## 2023-08-02 NOTE — Patient Instructions (Signed)
 Regimen will be flagyl 500 mg twice daily for 7 days then metrogel in the vagina twice a week for 4-6 months

## 2023-08-02 NOTE — Progress Notes (Signed)
  CC: vaginal discharge Subjective:    Patient ID: Angela Hayden, female    DOB: 2001/03/30, 23 y.o.   MRN: [id number]  HPI Pt seen for complaint of recurrent vaginal discharge/bacterial vaginosis.  Pt feels like she gets this discharge at least twice a month.  Pt does note frequent intercourse at least 3-4 times/week.  Explained frequent intercourse is an independent risk factor for BV. Discussed extended regimen flagyl therapy.   Review of Systems     Objective:   Physical Exam Vitals:   08/02/23 1412  BP: 99/68  Pulse: (!) 153   SSE: vaginal swab taken, no odor detected, copius white vaginal discharge noted, not clumpy, did not appear to be yeast      Assessment & Plan:   1. Vaginal discharge (Primary) If swab is positive for BV, will prescribe extended regimen flagyl therapy.  7days of oral flagyl followed by metrogel 1-2 timers/week for 4-6 months - Cervicovaginal ancillary only( [place]) F/u in 6 months.   Warden Fillers, MD Faculty Attending, Center for [facility]

## 2023-08-03 LAB — CERVICOVAGINAL ANCILLARY ONLY
Bacterial Vaginitis (gardnerella): POSITIVE — AB
Candida Glabrata: NEGATIVE
Candida Vaginitis: NEGATIVE
Chlamydia: NEGATIVE
Comment: NEGATIVE
Comment: NEGATIVE
Comment: NEGATIVE
Comment: NEGATIVE
Comment: NEGATIVE
Comment: NORMAL
Neisseria Gonorrhea: NEGATIVE
Trichomonas: NEGATIVE

## 2023-08-05 ENCOUNTER — Other Ambulatory Visit: Payer: Self-pay | Admitting: Obstetrics and Gynecology

## 2023-08-05 ENCOUNTER — Encounter: Payer: Self-pay | Admitting: Obstetrics and Gynecology

## 2023-08-05 DIAGNOSIS — N760 Acute vaginitis: Secondary | ICD-10-CM

## 2023-08-05 DIAGNOSIS — B9689 Other specified bacterial agents as the cause of diseases classified elsewhere: Secondary | ICD-10-CM

## 2023-08-05 MED ORDER — METRONIDAZOLE 0.75 % VA GEL
VAGINAL | 2 refills | Status: AC
Start: 2023-08-05 — End: ?

## 2023-08-05 MED ORDER — METRONIDAZOLE 500 MG PO TABS
500.0000 mg | ORAL_TABLET | Freq: Two times a day (BID) | ORAL | 0 refills | Status: AC
Start: 2023-08-05 — End: ?

## 2023-11-10 ENCOUNTER — Other Ambulatory Visit: Payer: Self-pay

## 2023-11-10 DIAGNOSIS — B9689 Other specified bacterial agents as the cause of diseases classified elsewhere: Secondary | ICD-10-CM

## 2023-11-10 MED ORDER — METRONIDAZOLE 500 MG PO TABS
500.0000 mg | ORAL_TABLET | Freq: Two times a day (BID) | ORAL | 0 refills | Status: DC
Start: 2023-11-10 — End: 2024-03-25

## 2023-11-10 MED ORDER — METRONIDAZOLE 0.75 % VA GEL: VAGINAL | 2 refills | Status: DC

## 2023-11-14 ENCOUNTER — Ambulatory Visit

## 2023-11-20 ENCOUNTER — Ambulatory Visit

## 2024-03-25 ENCOUNTER — Encounter (HOSPITAL_COMMUNITY): Payer: Self-pay | Admitting: Emergency Medicine

## 2024-03-25 ENCOUNTER — Ambulatory Visit (HOSPITAL_COMMUNITY)
Admission: EM | Admit: 2024-03-25 | Discharge: 2024-03-25 | Disposition: A | Discharge status: Home / Self Care | Attending: Family Medicine | Admitting: Family Medicine

## 2024-03-25 DIAGNOSIS — Z202 Contact with and (suspected) exposure to infections with a predominantly sexual mode of transmission: Secondary | ICD-10-CM | POA: Insufficient documentation

## 2024-03-25 LAB — HIV ANTIBODY (ROUTINE TESTING W REFLEX): HIV Screen 4th Generation wRfx: NONREACTIVE

## 2024-03-25 NOTE — ED Provider Notes (Signed)
 [facility]    CSN: [id number] Arrival date & time: 03/25/24  1503       History   Chief Complaint Chief Complaint  Patient presents with   SEXUALLY TRANSMITTED DISEASE    HPI Angela Hayden is a 23 y.o. female.   HPI Here for testing for STDs.  She does not have any vaginal discharge or itching or pelvic pain or fever or vomiting.  No dysuria.  She has a new partner and so would like to be tested, including blood work  NKDA  Last menstrual cycle was October 17 Past Medical History:  Diagnosis Date   Chlamydia trachomatis infection in pregnancy 07/20/2018   Iron deficiency anemia during pregnancy 07/20/2018    Patient Active Problem List   Diagnosis Date Noted   Visit for routine gyn exam 02/17/2023   STD exposure 02/17/2023    Past Surgical History:  Procedure Laterality Date   CESAREAN SECTION N/A 07/21/2018   Procedure: CESAREAN SECTION;  Surgeon: Eveline Lynwood MATSU, MD;  Location: [facility] BIRTHING SUIT[facility];  Service: Obstetrics;  Laterality: N/A;    OB History     Gravida  1   Para  1   Term  1   Preterm  0   AB  0   Living  1      SAB  0   IAB  0   Ectopic  0   Multiple  0   Live Births  1            Home Medications    Prior to Admission medications   Medication Sig Start Date End Date Taking? Authorizing Provider  amLODipine  (NORVASC ) 2.5 MG tablet Take 1 tablet (2.5 mg total) by mouth daily. Patient not taking: Reported on 10/04/2018 08/15/18 09/28/20  Eveline Lynwood MATSU, MD  ferrous sulfate  325 (65 FE) MG tablet Take 1 tablet (325 mg total) by mouth daily with breakfast. 09/25/18 09/28/20  Eveline Lynwood MATSU, MD    Family History Family History  Problem Relation Age of Onset   Healthy Mother    Healthy Father     Social History Social History   Tobacco Use   Smoking status: Never   Smokeless tobacco: Never  Vaping Use   Vaping status: Never Used  Substance Use Topics   Alcohol use: Yes    Comment: occasionally    Drug use: Not Currently    Types: Marijuana     Allergies   Patient has no known allergies.   Review of Systems Review of Systems   Physical Exam Triage Vital Signs ED Triage Vitals  Encounter Vitals Group     BP 03/25/24 1543 107/68     Girls Systolic BP Percentile --      Girls Diastolic BP Percentile --      Boys Systolic BP Percentile --      Boys Diastolic BP Percentile --      Pulse Rate 03/25/24 1543 (!) 59     Resp 03/25/24 1543 16     Temp 03/25/24 1543 98.3 F (36.8 C)     Temp Source 03/25/24 1543 Oral     SpO2 03/25/24 1543 98 %     Weight --      Height --      Head Circumference --      Peak Flow --      Pain Score 03/25/24 1542 0     Pain Loc --      Pain Education --  Exclude from Growth Chart --    No data found.  Updated Vital Signs BP 107/68 (BP Location: Right Arm)   Pulse (!) 59   Temp 98.3 F (36.8 C) (Oral)   Resp 16   LMP 03/15/2024 (Approximate)   SpO2 98%   Visual Acuity Right Eye Distance:   Left Eye Distance:   Bilateral Distance:    Right Eye Near:   Left Eye Near:    Bilateral Near:     Physical Exam Vitals reviewed.  Constitutional:      General: She is not in acute distress.    Appearance: She is not toxic-appearing.  Skin:    Coloration: Skin is not pale.  Neurological:     Mental Status: She is alert and oriented to person, place, and time.  Psychiatric:        Behavior: Behavior normal.      UC Treatments / Results  Labs (all labs ordered are listed, but only abnormal results are displayed) Labs Reviewed  RPR  HIV ANTIBODY (ROUTINE TESTING W REFLEX)  CERVICOVAGINAL ANCILLARY ONLY    EKG   Radiology No results found.  Procedures Procedures (including critical care time)  Medications Ordered in UC Medications - No data to display  Initial Impression / Assessment and Plan / UC Course  I have reviewed the triage vital signs and the nursing notes.  Pertinent labs & imaging results that  were available during my care of the patient were reviewed by me and considered in my medical decision making (see chart for details).    Blood is drawn for HIV and RPR, and staff will notify them if any of that is positive  Vaginal self swab is done, and we will notify of any positives on that and treat per protocol.  Final Clinical Impressions(s) / UC Diagnoses   Final diagnoses:  Potential exposure to STD     Discharge Instructions      Staff will notify you if there is anything positive on the swab. It can take 2-3 days for the tests to result, depending on the day of the week your test was taken. You will only be notified if there are any positives on the testing; test results will also go to your MyChart if you are signed up for MyChart.       ED Prescriptions   None    PDMP not reviewed this encounter.   Vonna Sharlet POUR, MD 03/25/24 [phone]

## 2024-03-25 NOTE — Discharge Instructions (Signed)
 Staff will notify you if there is anything positive on the swab. It can take 2-3 days for the tests to result, depending on the day of the week your test was taken. You will only be notified if there are any positives on the testing; test results will also go to your MyChart if you are signed up for MyChart.

## 2024-03-25 NOTE — ED Triage Notes (Signed)
Pt requesting STD testing. Denies exposure or s/s.  

## 2024-03-26 LAB — CERVICOVAGINAL ANCILLARY ONLY
Chlamydia: NEGATIVE
Comment: NEGATIVE
Comment: NEGATIVE
Comment: NORMAL
Neisseria Gonorrhea: NEGATIVE
Trichomonas: NEGATIVE

## 2024-03-26 LAB — RPR: RPR Ser Ql: NONREACTIVE

## 2024-05-15 ENCOUNTER — Encounter (HOSPITAL_COMMUNITY): Payer: Self-pay | Admitting: Emergency Medicine

## 2024-05-15 ENCOUNTER — Ambulatory Visit (HOSPITAL_COMMUNITY)
Admission: EM | Admit: 2024-05-15 | Discharge: 2024-05-15 | Disposition: A | Discharge status: Home / Self Care | Source: Home / Self Care | Attending: Family Medicine | Admitting: Family Medicine

## 2024-05-15 DIAGNOSIS — N898 Other specified noninflammatory disorders of vagina: Secondary | ICD-10-CM | POA: Insufficient documentation

## 2024-05-15 DIAGNOSIS — Z113 Encounter for screening for infections with a predominantly sexual mode of transmission: Secondary | ICD-10-CM | POA: Insufficient documentation

## 2024-05-15 NOTE — Discharge Instructions (Addendum)
 We have sent testing for sexually transmitted infections including gonorrhea and chlamydia. We will notify you of any positive results once they are received. If required, we will prescribe any medications you might need.  Please refrain from all sexual activity for at least the next seven days.

## 2024-05-15 NOTE — ED Provider Notes (Signed)
 [facility]   [id number] 05/15/24 Arrival Time: 9173  ASSESSMENT & PLAN:  1. Vaginal discharge   2. Screening for STDs (sexually transmitted diseases)       Discharge Instructions      We have sent testing for sexually transmitted infections including gonorrhea and chlamydia. We will notify you of any positive results once they are received. If required, we will prescribe any medications you might need.  Please refrain from all sexual activity for at least the next seven days.    Overall benign abd exam. Without CMT on pelvic exam. No FB appreciated within vagina.  Labs Reviewed  CERVICOVAGINAL ANCILLARY ONLY  OTC analgesics as needed.    Discharge Instructions      We have sent testing for sexually transmitted infections including gonorrhea and chlamydia. We will notify you of any positive results once they are received. If required, we will prescribe any medications you might need.  Please refrain from all sexual activity for at least the next seven days.     Pt left before UPT collected.  Reviewed expectations re: course of current medical issues. Questions answered. Outlined signs and symptoms indicating need for more acute intervention. Patient verbalized understanding. After Visit Summary given.   SUBJECTIVE:  Angela Hayden is a 23 y.o. female who questions if she has a tampon stuck in vagina. Pt believes that she has a tampon stuck for 2 days. Reports having pain on her right side.  Pain is dull and mild to moderate. Has not affected sleep. She is on period. Unsure of vaginal discharge. Sexually active with one female partner.   Patient's last menstrual period was 05/09/2024 (approximate).   OBJECTIVE:  Vitals:   05/15/24 0840  BP: 108/71  Pulse: 86  Resp: 14  Temp: 98.1 F (36.7 C)  TempSrc: Oral  SpO2: 98%     General appearance: alert, cooperative, appears stated age and no distress Lungs: unlabored respirations; speaks full  sentences without difficulty Back: no CVA tenderness; FROM at waist Abdomen: soft, mild R inguinal TTP without masses GU: (nurse chaperone present) normal appearing; without CMT; without gross bleeding; without appreciable FB Skin: warm and dry Psychological: alert and cooperative; normal mood and affect.    Labs Reviewed  CERVICOVAGINAL ANCILLARY ONLY    Allergies[1]  Past Medical History:  Diagnosis Date   Chlamydia trachomatis infection in pregnancy 07/20/2018   Iron deficiency anemia during pregnancy 07/20/2018   Family History  Problem Relation Age of Onset   Healthy Mother    Healthy Father    Social History   Socioeconomic History   Marital status: Single    Spouse name: Not on file   Number of children: Not on file   Years of education: Not on file   Highest education level: Not on file  Occupational History   Not on file  Tobacco Use   Smoking status: Never   Smokeless tobacco: Never  Vaping Use   Vaping status: Never Used  Substance and Sexual Activity   Alcohol use: Yes    Comment: occasionally   Drug use: Not Currently    Types: Marijuana   Sexual activity: Yes    Birth control/protection: None  Other Topics Concern   Not on file  Social History Narrative   Not on file   Social Drivers of Health   Tobacco Use: High Risk (11/18/2023)   Received from [facility]   Patient History    Smoking Tobacco Use: Every Day    Smokeless  Tobacco Use: Never    Passive Exposure: Not on file  Financial Resource Strain: Not on file  Food Insecurity: Not on file  Transportation Needs: Not on file  Physical Activity: Not on file  Stress: Not on file  Social Connections: Unknown (10/03/2022)   Received from [facility]   Social Network    Social Network: Not on file  Intimate Partner Violence: Unknown (10/03/2022)   Received from [facility]   HITS    Physically Hurt: Not on file    Insult or Talk Down To: Not on file    Threaten Physical Harm: Not on file     Scream or Curse: Not on file  Depression (PHQ2-9): Low Risk (10/14/2022)   Depression (PHQ2-9)    PHQ-2 Score: 4  Alcohol Screen: Not on file  Housing: Not on file  Utilities: Not on file  Health Literacy: Not on file            [1] No Known Allergies    Rolinda Rogue, MD 05/15/24 [phone]

## 2024-05-15 NOTE — ED Triage Notes (Signed)
 Pt believes that she has a tampon stuck for 2 days. Reports having pain on her right side.

## 2024-05-16 LAB — CERVICOVAGINAL ANCILLARY ONLY
Bacterial Vaginitis (gardnerella): NEGATIVE
Candida Glabrata: NEGATIVE
Candida Vaginitis: NEGATIVE
Chlamydia: NEGATIVE
Comment: NEGATIVE
Comment: NEGATIVE
Comment: NEGATIVE
Comment: NEGATIVE
Comment: NEGATIVE
Comment: NORMAL
Neisseria Gonorrhea: NEGATIVE
Trichomonas: NEGATIVE
